# Patient Record
Sex: Male | Born: 1965 | Race: Black or African American | Hispanic: No | Marital: Single | Smoking: Never smoker
Health system: Southern US, Community
[De-identification: ages and names within clinical notes are randomized; demographics above are authoritative.]

## PROBLEM LIST (undated history)

## (undated) DIAGNOSIS — Q8583 Von Hippel-Lindau syndrome: Secondary | ICD-10-CM

## (undated) DIAGNOSIS — IMO0002 Reserved for concepts with insufficient information to code with codable children: Secondary | ICD-10-CM

## (undated) DIAGNOSIS — Q858 Other phakomatoses, not elsewhere classified: Secondary | ICD-10-CM

## (undated) DIAGNOSIS — D496 Neoplasm of unspecified behavior of brain: Secondary | ICD-10-CM

## (undated) DIAGNOSIS — K219 Gastro-esophageal reflux disease without esophagitis: Secondary | ICD-10-CM

## (undated) DIAGNOSIS — N289 Disorder of kidney and ureter, unspecified: Secondary | ICD-10-CM

## (undated) HISTORY — DX: Von Hippel-Lindau syndrome: Q85.83

## (undated) HISTORY — DX: Other phakomatoses, not elsewhere classified: Q85.8

## (undated) HISTORY — DX: Gastro-esophageal reflux disease without esophagitis: K21.9

## (undated) HISTORY — PX: KIDNEY SURGERY: SHX687

---

## 1999-09-11 HISTORY — PX: BRAIN TUMOR EXCISION: SHX577

## 2003-04-14 ENCOUNTER — Encounter: Payer: Self-pay | Admitting: Internal Medicine

## 2003-04-14 ENCOUNTER — Ambulatory Visit (HOSPITAL_COMMUNITY): Admission: RE | Admit: 2003-04-14 | Discharge: 2003-04-14 | Payer: Self-pay | Admitting: Internal Medicine

## 2005-03-15 ENCOUNTER — Ambulatory Visit (HOSPITAL_COMMUNITY): Admission: RE | Admit: 2005-03-15 | Discharge: 2005-03-15 | Payer: Self-pay | Admitting: Internal Medicine

## 2005-09-07 ENCOUNTER — Ambulatory Visit (HOSPITAL_COMMUNITY): Admission: RE | Admit: 2005-09-07 | Discharge: 2005-09-07 | Payer: Self-pay | Admitting: Family Medicine

## 2006-05-26 ENCOUNTER — Emergency Department (HOSPITAL_COMMUNITY): Admission: EM | Admit: 2006-05-26 | Discharge: 2006-05-26 | Payer: Self-pay | Admitting: Emergency Medicine

## 2007-06-19 ENCOUNTER — Ambulatory Visit: Payer: Self-pay | Admitting: Urgent Care

## 2007-06-30 ENCOUNTER — Ambulatory Visit (HOSPITAL_COMMUNITY): Admission: RE | Admit: 2007-06-30 | Discharge: 2007-06-30 | Payer: Self-pay | Admitting: Gastroenterology

## 2007-06-30 ENCOUNTER — Ambulatory Visit: Payer: Self-pay | Admitting: Gastroenterology

## 2007-06-30 ENCOUNTER — Encounter: Payer: Self-pay | Admitting: Gastroenterology

## 2007-06-30 HISTORY — PX: COLONOSCOPY: SHX174

## 2010-06-17 ENCOUNTER — Emergency Department (HOSPITAL_COMMUNITY): Admission: EM | Admit: 2010-06-17 | Discharge: 2010-06-17 | Payer: Self-pay | Admitting: Emergency Medicine

## 2010-06-18 ENCOUNTER — Emergency Department (HOSPITAL_COMMUNITY): Admission: EM | Admit: 2010-06-18 | Discharge: 2010-06-18 | Payer: Self-pay | Admitting: Emergency Medicine

## 2010-06-19 ENCOUNTER — Emergency Department (HOSPITAL_COMMUNITY): Admission: EM | Admit: 2010-06-19 | Discharge: 2010-06-19 | Payer: Self-pay | Admitting: Emergency Medicine

## 2010-11-22 LAB — WOUND CULTURE: Gram Stain: NONE SEEN

## 2010-12-16 ENCOUNTER — Emergency Department (HOSPITAL_COMMUNITY)
Admission: EM | Admit: 2010-12-16 | Discharge: 2010-12-16 | Disposition: A | Discharge status: Home / Self Care | Payer: BC Managed Care – PPO | Attending: Emergency Medicine | Admitting: Emergency Medicine

## 2010-12-16 DIAGNOSIS — T2016XA Burn of first degree of forehead and cheek, initial encounter: Secondary | ICD-10-CM | POA: Insufficient documentation

## 2010-12-16 DIAGNOSIS — T2121XA Burn of second degree of chest wall, initial encounter: Secondary | ICD-10-CM | POA: Insufficient documentation

## 2010-12-16 DIAGNOSIS — T2017XA Burn of first degree of neck, initial encounter: Secondary | ICD-10-CM | POA: Insufficient documentation

## 2010-12-16 DIAGNOSIS — X010XXA Exposure to flames in uncontrolled fire, not in building or structure, initial encounter: Secondary | ICD-10-CM | POA: Insufficient documentation

## 2010-12-16 DIAGNOSIS — T23219A Burn of second degree of unspecified thumb (nail), initial encounter: Secondary | ICD-10-CM | POA: Insufficient documentation

## 2010-12-16 DIAGNOSIS — T22219A Burn of second degree of unspecified forearm, initial encounter: Secondary | ICD-10-CM | POA: Insufficient documentation

## 2010-12-16 DIAGNOSIS — T23169A Burn of first degree of back of unspecified hand, initial encounter: Secondary | ICD-10-CM | POA: Insufficient documentation

## 2010-12-16 DIAGNOSIS — T310 Burns involving less than 10% of body surface: Secondary | ICD-10-CM | POA: Insufficient documentation

## 2010-12-16 DIAGNOSIS — T20119A Burn of first degree of unspecified ear [any part, except ear drum], initial encounter: Secondary | ICD-10-CM | POA: Insufficient documentation

## 2010-12-17 ENCOUNTER — Emergency Department (HOSPITAL_COMMUNITY)
Admission: EM | Admit: 2010-12-17 | Discharge: 2010-12-17 | Disposition: A | Discharge status: Home / Self Care | Payer: BC Managed Care – PPO | Attending: Emergency Medicine | Admitting: Emergency Medicine

## 2010-12-17 DIAGNOSIS — T22219A Burn of second degree of unspecified forearm, initial encounter: Secondary | ICD-10-CM | POA: Insufficient documentation

## 2010-12-17 DIAGNOSIS — T310 Burns involving less than 10% of body surface: Secondary | ICD-10-CM | POA: Insufficient documentation

## 2010-12-17 DIAGNOSIS — T2010XA Burn of first degree of head, face, and neck, unspecified site, initial encounter: Secondary | ICD-10-CM | POA: Insufficient documentation

## 2010-12-17 DIAGNOSIS — T2111XA Burn of first degree of chest wall, initial encounter: Secondary | ICD-10-CM | POA: Insufficient documentation

## 2010-12-17 DIAGNOSIS — IMO0002 Reserved for concepts with insufficient information to code with codable children: Secondary | ICD-10-CM | POA: Insufficient documentation

## 2011-01-17 ENCOUNTER — Inpatient Hospital Stay (INDEPENDENT_AMBULATORY_CARE_PROVIDER_SITE_OTHER)
Admission: RE | Admit: 2011-01-17 | Discharge: 2011-01-17 | Disposition: A | Discharge status: Home / Self Care | Payer: BC Managed Care – PPO | Source: Ambulatory Visit

## 2011-01-17 DIAGNOSIS — IMO0002 Reserved for concepts with insufficient information to code with codable children: Secondary | ICD-10-CM

## 2011-01-23 NOTE — Consult Note (Signed)
Daniel [name], [name]               ACCOUNT NO.:  [id number]   MEDICAL RECORD NO.:  [id number]          PATIENT TYPE:  AMB   LOCATION:  DAY                           FACILITY:  [facility]   PHYSICIAN:  Kassie Mends, M.D.      DATE OF BIRTH:  07-07-1966   DATE OF CONSULTATION:  DATE OF DISCHARGE:                                 CONSULTATION   REQUESTING PHYSICIAN:  Dr. Felecia Shelling.   REASON FOR CONSULTATION:  Rectal bleeding.   HISTORY OF PRESENT ILLNESS:  Daniel [name] is a 45 year old African-  American male.  He tells me, approximately 2 weeks ago, he had two  episodes of moderate to large volume rectal bleeding.  He noticed blood  in his stool.  He says he may have been a little bit constipated but  generally has a bowel movement every day.  He denies any problems with  diarrhea.  Denies abdominal pain.  He did notice a little bit of burning  proctalgia.  He denies any pruritus.  Denies any fever or chills.  Denies any nausea or vomiting.  He occasionally has heartburn for which  he takes Prevacid 30 mg daily, and this is well controlled.  He denies  anorexia or early satiety.  He denies any NSAID or aspirin use.   PAST MEDICAL AND SURGICAL HISTORY:  Chronic GERD.  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  In 2006, he had a right nephrectomy.  In  2007, he has history of  Von-Hipple Lindau syndrome.   CURRENT MEDICATIONS:  Prevacid 30 mg daily.   ALLERGIES:  NO KNOWN DRUG ALLERGIES.   FAMILY HISTORY:  There is no known family history of carcinoma of the  liver or chronic GI problems.   SOCIAL HISTORY:  Mr. Lamping is single.  He is engaged to be married.  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  He denies any tobacco or drug use.  He  consumes an alcoholic beverage once or twice per month.   REVIEW OF SYSTEMS:  See HPI.  Otherwise negative.   PHYSICAL EXAM:  VITAL SIGNS:  Weight 208 pounds, height 69 inches,  temperature 98.2, blood  pressure 104/68, pulse 60.  GENERAL:  Daniel [name] is a well-developed, well-nourished African-  American male in no acute distress.  HEENT.  Clear nonicteric.  Pink.  Oropharynx pink, moist without any  lesions.  NECK:  Supple without mass or thyromegaly.  CHEST:  Heart regular, rate and rhythm.  Normal S1-S2.  No rubs, clicks,  rubs or gallops.  LUNGS:  Clear auscultation bilaterally.  ABDOMEN:  Positive bowel sounds x4.  No bruits auscultated.  Soft,  nontender, nondistended without palpable mass or hepatosplenomegaly.  No  rebound tenderness or guarding.  EXTREMITIES:  Without clubbing or edema bilaterally.  RECTAL:  No external lesions visualized.  Good sphincter tone.  No  internal masses palpated.  Small amount of light brown stool was  obtained which is Hemoccult negative.   IMPRESSION:  1. Daniel [name] is a 45 year old African male with  two episodes of      moderate-to-large volume rectal bleeding.  Rectal exam in the      office today was normal, and I feel he needs further evaluation to      rule out colorectal carcinoma, as well as determine source of his      rectal bleeding, which could be benign anorectal source, such as      fissure or internal hemorrhoids, as well as diverticular bleeding.      This case has been discussed with Dr. Cira Servant in our plan of care as      outlined below.  2. He has chronic gastroesophageal reflux disease, well controlled on      proton pump.   PLAN:  Colonoscopy with Dr. Cira Servant in the near future.  I discussed the  procedure including risks and benefits including but not limited to  bleeding, infection, perforation, drug reaction.  He agrees with plan.  Consent will be obtained.  He is going to have NuLYTELY prep. The plan  is to continue Prevacid 30 grams daily.   I would like to Dr. Felecia Shelling for allowing [place] to participate in the care of  Daniel [name].      Lorenza Burton, N.P.      Kassie Mends, M.D.  Electronically Signed    KJ/MEDQ   D:  06/19/2007  T:  06/20/2007  Job:  [id number]

## 2011-01-23 NOTE — Op Note (Signed)
NAMEJAYJAY, Daniel [name]               ACCOUNT NO.:  [id number]   MEDICAL RECORD NO.:  [id number]          PATIENT TYPE:  AMB   LOCATION:  DAY                           FACILITY:  [facility]   PHYSICIAN:  Kassie Mends, M.D.      DATE OF BIRTH:  1966-02-02   DATE OF PROCEDURE:  06/30/2007  DATE OF DISCHARGE:                               OPERATIVE REPORT   REFERRING PHYSICIAN:  Dr. Felecia Shelling.   PROCEDURE:  Colonoscopy with cold forceps polypectomy.   INDICATION FOR EXAMINATION:  Daniel [name] is a 45 year old male who  complains of rectal bleeding.  He has seen rectal bleeding for about a  month.  It is intermittent.   FINDINGS:  1. A 4- to 5-mm sessile rectal polyp removed via cold forceps.  2. Area in the transverse colon which appeared to be a healing      ulcerated lesion.  Biopsies obtained via cold forceps.  There was      no evidence of ulceration, just erosion. Otherwise no masses,      diverticular or AVMs seen.  3. Approximately 5 cm of the distal terminal ileum was visualized.  4. Small internal hemorrhoids.   RECOMMENDATIONS:  1. Hemorrhoids may be the likely source of rectal bleeding.  The      rectal polyp was too small to be clinically significant.  He should      follow a high-fiber diet.  He is given a handout on high-fiber      diet, polyps and hemorrhoids.  2. Will call Mr. Pineo with results of his polypectomy.  If his      polyps adenomatous, then he should have a screening colonoscopy in      5 years, and as well, all first-degree relatives should have a      screening colonoscopy at age 89 and then every 5 years.  3. No aspirin, NSAIDs or anticoagulation for 5 days.   MEDICATIONS:  1. Demerol 100 mg IV.  2. Versed 6 mg IV.   PROCEDURE TECHNIQUE:  Physical exam was performed.  Informed consent was  obtained from the patient explaining the benefits, risks and  alternatives to the procedure.  The patient was connected to monitor and  placed in left lateral position.   Continuous oxygen was provided by  nasal cannula and IV medicine administered through an indwelling  cannula.  After administration of sedation and rectal exam, the  patient's rectum was intubated, and the  scope was advanced under direct visualization to the distal terminal  ileum.  The scope was removed slowly by carefully examining the color,  texture, anatomy and integrity of the mucosa on the way out.  The  patient was recovered in endoscopy and discharged home in satisfactory  condition.      Kassie Mends, M.D.  Electronically Signed     SM/MEDQ  D:  06/30/2007  T:  07/01/2007  Job:  [id number]   cc:   Tesfaye D. Felecia Shelling, MD  Fax: [phone]

## 2011-01-26 NOTE — Procedures (Signed)
[name], Daniel [name]               ACCOUNT NO.:  [id number]   MEDICAL RECORD NO.:  [id number]          PATIENT TYPE:  OUT   LOCATION:  RAD                           FACILITY:  [facility]   PHYSICIAN:  Edward L. Juanetta Gosling, M.D.DATE OF BIRTH:  11-18-1965   DATE OF PROCEDURE:  03/15/2005  DATE OF DISCHARGE:                              PULMONARY FUNCTION TEST   RESULTS:  1.  Spirometry is normal.  2.  Lung volumes show a minimal decrease in total lung capacity.  3.  Diffusion capacity of carbon dioxide (DLCO) is normal.  4.  Arterial blood gasses are normal.  The mild reduction in total lung      capacity may be related to body habitus.  He is listed as being  66      inches tall and 215 pounds.       ELH/MEDQ  D:  03/22/2005  T:  03/22/2005  Job:  [id number]

## 2012-04-23 ENCOUNTER — Encounter (HOSPITAL_COMMUNITY): Payer: Self-pay | Admitting: Emergency Medicine

## 2012-04-23 ENCOUNTER — Emergency Department (HOSPITAL_COMMUNITY)
Admission: EM | Admit: 2012-04-23 | Discharge: 2012-04-24 | Disposition: A | Discharge status: Home / Self Care | Payer: BC Managed Care – PPO | Attending: Emergency Medicine | Admitting: Emergency Medicine

## 2012-04-23 DIAGNOSIS — K648 Other hemorrhoids: Secondary | ICD-10-CM | POA: Insufficient documentation

## 2012-04-23 DIAGNOSIS — K219 Gastro-esophageal reflux disease without esophagitis: Secondary | ICD-10-CM | POA: Insufficient documentation

## 2012-04-23 DIAGNOSIS — N289 Disorder of kidney and ureter, unspecified: Secondary | ICD-10-CM | POA: Insufficient documentation

## 2012-04-23 DIAGNOSIS — K625 Hemorrhage of anus and rectum: Secondary | ICD-10-CM

## 2012-04-23 HISTORY — DX: Disorder of kidney and ureter, unspecified: N28.9

## 2012-04-23 HISTORY — DX: Neoplasm of unspecified behavior of brain: D49.6

## 2012-04-23 HISTORY — DX: Reserved for concepts with insufficient information to code with codable children: IMO0002

## 2012-04-23 LAB — CBC WITH DIFFERENTIAL/PLATELET
Basophils Relative: 0 % (ref 0–1)
Eosinophils Absolute: 0.1 10*3/uL (ref 0.0–0.7)
Eosinophils Relative: 0 % (ref 0–5)
MCH: 25.4 pg — ABNORMAL LOW (ref 26.0–34.0)
MCHC: 33.5 g/dL (ref 30.0–36.0)
Neutrophils Relative %: 90 % — ABNORMAL HIGH (ref 43–77)
Platelets: 191 10*3/uL (ref 150–400)
RDW: 13.9 % (ref 11.5–15.5)

## 2012-04-23 LAB — BASIC METABOLIC PANEL
Calcium: 9.5 mg/dL (ref 8.4–10.5)
GFR calc Af Amer: 72 mL/min — ABNORMAL LOW (ref >90)
GFR calc non Af Amer: 62 mL/min — ABNORMAL LOW (ref >90)
Potassium: 3.7 mEq/L (ref 3.5–5.1)
Sodium: 138 mEq/L (ref 135–145)

## 2012-04-23 MED ORDER — PANTOPRAZOLE SODIUM 40 MG IV SOLR
40.0000 mg | Freq: Once | INTRAVENOUS | Status: AC
  Administered 2012-04-23: 40 mg via INTRAVENOUS
  Filled 2012-04-23: qty 40

## 2012-04-23 MED ORDER — ONDANSETRON HCL 4 MG/2ML IJ SOLN
4.0000 mg | Freq: Once | INTRAMUSCULAR | Status: AC
  Administered 2012-04-23: 4 mg via INTRAVENOUS
  Filled 2012-04-23: qty 2

## 2012-04-23 NOTE — ED Notes (Signed)
Patient reports rectal bleeding that started this morning, reports bright red blood. States decreased throughout the day and has stopped now. Patient also reports nausea and emesis today, denies blood in emesis. Also complaining of chills, denies pain.

## 2012-04-23 NOTE — ED Provider Notes (Signed)
History     CSN: [id number]  Arrival date & time 04/23/12  2249   First MD Initiated Contact with Patient 04/23/12 2305      Chief Complaint  Patient presents with  . Chills  . Rectal Bleeding  . Emesis    (Consider location/radiation/quality/duration/timing/severity/associated sxs/prior treatment) HPI  Daniel [name] is a 46 y.o. male who presents to the Emergency Department complaining of rectal bleeding that began this morning. It started as bright red blood and has decreased over the course of the day. Now with faint blood. Has had a shaking chill, nausea and vomiting this evening.  He says he may have been a little bit constipated but generally has a bowel movement every day.He denies any pruritus. Denies any fever or chills, NSAID or aspirin use.   Patient with previous rectal bleeding in 2008. Evaluated by Dr. Darrick Penna, GI. Colonoscopy performed 06-30-2007 with :FINDINGS:  1. A 4- to 5-mm sessile rectal polyp removed via cold forceps.  2. Area in the transverse colon which appeared to be a healing  ulcerated lesion. Biopsies obtained via cold forceps. There was  no evidence of ulceration, just erosion. Otherwise no masses,  diverticular or AVMs seen.  3. Approximately 5 cm of the distal terminal ileum was visualized.  4. Small internal hemorrhoids  PCP Dr. Felecia Shelling  Past Medical History  Diagnosis Date  . Brain tumor   . Renal disorder   . Abdominal cyst   PAST MEDICAL AND SURGICAL HISTORY: Chronic GERD. 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 In 2006, he had a right nephrectomy. In  2007, he has history of Von-Hipple Lindau syndrome.   Past Surgical History  Procedure Date  . Brain tumor excision   . Kidney surgery     History reviewed. No pertinent family history.  History  Substance Use Topics  . Smoking status: Never Smoker   . Smokeless tobacco: Not on file  . Alcohol Use: Yes     occassional        Review of Systems  Constitutional: Positive for fever and chills.       10 Systems reviewed and are negative for acute change except as noted in the HPI.  HENT: Negative for congestion.   Eyes: Negative for discharge and redness.  Respiratory: Negative for cough and shortness of breath.   Cardiovascular: Negative for chest pain.  Gastrointestinal: Positive for nausea, vomiting, anal bleeding and rectal pain. Negative for abdominal pain.  Musculoskeletal: Negative for back pain.  Skin: Negative for rash.  Neurological: Negative for syncope, numbness and headaches.  Psychiatric/Behavioral:       No behavior change.    Allergies  Review of patient's allergies indicates no known allergies.  Home Medications  No current outpatient prescriptions on file.  BP 122/83  Pulse 109  Temp 100.5 F (38.1 C) (Oral)  Resp 18  Ht 5\' 8"  (1.727 m)  Wt 215 lb (97.523 kg)  BMI 32.69 kg/m2  SpO2 97%  Physical Exam  Nursing note and vitals reviewed. Constitutional: He appears well-developed and well-nourished.       Awake, alert, nontoxic appearance.  HENT:  Head: Atraumatic.  Eyes: Right eye exhibits no discharge. Left eye exhibits no discharge.  Neck: Neck supple.  Pulmonary/Chest: Effort normal. He exhibits no tenderness.  Abdominal: Soft. There is no tenderness. There is no rebound and no guarding.  Genitourinary:       Tenderness with palpation of  rectum, small internal hemorrhoids. Trace positive guiac  Musculoskeletal: He exhibits no tenderness.       Baseline ROM, no obvious new focal weakness.  Neurological:       Mental status and motor strength appears baseline for patient and situation.  Skin: No rash noted.  Psychiatric: He has a normal mood and affect.    ED Course  Procedures (including critical care time)  Results for orders placed during the hospital encounter of 04/23/12  CBC WITH DIFFERENTIAL      Component Value Range   WBC 12.1 (*) 4.0 - 10.5 K/uL    RBC 5.44  4.22 - 5.81 MIL/uL   Hemoglobin 13.8  13.0 - 17.0 g/dL   HCT 16.1  09.6 - 04.5 %   MCV 75.7 (*) 78.0 - 100.0 fL   MCH 25.4 (*) 26.0 - 34.0 pg   MCHC 33.5  30.0 - 36.0 g/dL   RDW 40.9  81.1 - 91.4 %   Platelets 191  150 - 400 K/uL   Neutrophils Relative 90 (*) 43 - 77 %   Neutro Abs 10.9 (*) 1.7 - 7.7 K/uL   Lymphocytes Relative 8 (*) 12 - 46 %   Lymphs Abs 1.0  0.7 - 4.0 K/uL   Monocytes Relative 1 (*) 3 - 12 %   Monocytes Absolute 0.2  0.1 - 1.0 K/uL   Eosinophils Relative 0  0 - 5 %   Eosinophils Absolute 0.1  0.0 - 0.7 K/uL   Basophils Relative 0  0 - 1 %   Basophils Absolute 0.0  0.0 - 0.1 K/uL  BASIC METABOLIC PANEL      Component Value Range   Sodium 138  135 - 145 mEq/L   Potassium 3.7  3.5 - 5.1 mEq/L   Chloride 104  96 - 112 mEq/L   CO2 22  19 - 32 mEq/L   Glucose, Bld 101 (*) 70 - 99 mg/dL   BUN 24 (*) 6 - 23 mg/dL   Creatinine, Ser 7.82  0.50 - 1.35 mg/dL   Calcium 9.5  8.4 - 95.6 mg/dL   GFR calc non Af Amer 62 (*) >90 mL/min   GFR calc Af Amer 72 (*) >90 mL/min     MDM  Patient with rectal bleeding that began this morning. Bleeding has markedly decreased over the course of the day. PE with tenderness and internal hemorrhoids. Hgb is stable. Dx testing d/w pt and family.  Questions answered.  Verb understanding, agreeable to d/c home with outpt f/u with Dr. Darrick Penna.Pt stable in ED with no significant deterioration in condition.The patient appears reasonably screened and/or stabilized for discharge and I doubt any other medical condition or other [facility] requiring further screening, evaluation, or treatment in the ED at this time prior to discharge.  MDM Reviewed: nursing note and vitals Interpretation: labs            Nicoletta Dress. Colon Branch, MD 04/24/12 [phone]

## 2012-04-28 ENCOUNTER — Encounter: Payer: Self-pay | Admitting: Gastroenterology

## 2012-04-28 ENCOUNTER — Ambulatory Visit (INDEPENDENT_AMBULATORY_CARE_PROVIDER_SITE_OTHER): Payer: BC Managed Care – PPO | Admitting: Gastroenterology

## 2012-04-28 VITALS — BP 108/70 | HR 66 | Temp 97.6°F | Ht 68.0 in | Wt 200.6 lb

## 2012-04-28 DIAGNOSIS — K625 Hemorrhage of anus and rectum: Secondary | ICD-10-CM | POA: Insufficient documentation

## 2012-04-28 MED ORDER — HYDROCORTISONE ACETATE 25 MG RE SUPP: 25.0000 mg | Freq: Two times a day (BID) | RECTAL | Status: DC

## 2012-04-28 MED ORDER — PEG 3350-KCL-NA BICARB-NACL 420 G PO SOLR: 4000.0000 L | ORAL | Status: AC

## 2012-04-28 NOTE — Assessment & Plan Note (Signed)
46 year old with rectal bleeding in the setting of known internal hemorrhoids, last TCS 2008 with adenomatous polyps, now due for surveillance. Likely benign source; however, as he is due for surveillance colonoscopy this year, we will proceed at this point with lower GI tract evaluation. Will provide Anusol suppositories as well. Instructions to seek medical attention if any worsening of symptoms.   Proceed with colonoscopy with Dr. Darrick Penna in the near future. The risks, benefits, and alternatives have been discussed in detail with the patient. They state understanding and desire to proceed.

## 2012-04-28 NOTE — Progress Notes (Signed)
Primary Care Physician:  Avon Gully, MD Primary Gastroenterologist:  Dr. Darrick Penna   Chief Complaint  Patient presents with  . Rectal Bleeding    HPI:   46 year old who presents today for recent onset of rectal bleeding, starting a few days ago. Started as large volume then lightened up, finally stopping. No rectal pain or pruritis. States soft stools. No fever, chills. No abdominal pain. No wt loss or lack of appetite. Ibuprofen sparingly for headaches. No aspirin powders. Last TCS by Dr. Darrick Penna in 2008 with tubular adenoma, internal hemorrhoids, benign healing ulcerated lesion.   Seen at [facility] ED 8/14, physical exam with small internal hemorrhoids.   Lab Results  Component Value Date   WBC 12.1* 04/23/2012   HGB 13.8 04/23/2012   HCT 41.2 04/23/2012   MCV 75.7* 04/23/2012   PLT 191 04/23/2012     Past Medical History  Diagnosis Date  . Brain tumor   . Renal disorder   . Abdominal cyst   . VHL (von Hippel-Lindau syndrome)   . GERD (gastroesophageal reflux disease)     Past Surgical History  Procedure Date  . Brain tumor excision   . Kidney surgery   . Colonoscopy 06/30/2007    area of transverse colon appearing to be healing ulcerated lesion, benign/tubular adenoma / Approximately 5 cm of the distal terminal ileum was visualized/  Small internal hemorrhoids    Current Outpatient Prescriptions  Medication Sig Dispense Refill  . hydrocortisone (ANUSOL-HC) 25 MG suppository Place 1 suppository (25 mg total) rectally every 12 (twelve) hours.  14 suppository  1  . polyethylene glycol-electrolytes (TRILYTE) 420 G solution Take 4,000,000 mLs by mouth as directed.  4000 mL  0    Allergies as of 04/28/2012  . (No Known Allergies)    Family History  Problem Relation Age of Onset  . Colon cancer Neg Hx     History   Social History  . Marital Status: Single    Spouse Name: N/A    Number of Children: N/A  . Years of Education: N/A   Occupational History  . Not on file.     Social History Main Topics  . Smoking status: Never Smoker   . Smokeless tobacco: Not on file  . Alcohol Use: Yes     occassional  . Drug Use: No  . Sexually Active:    Other Topics Concern  . Not on file   Social History Narrative  . No narrative on file    Review of Systems: Gen: Denies any fever, chills, loss of appetite, fatigue, weight loss. CV: Denies chest pain, heart palpitations, syncope, peripheral edema. Resp: Denies shortness of breath with rest, cough, wheezing GI: Denies dysphagia or odynophagia. Denies hematemesis, fecal incontinence, or jaundice.  GU : Denies urinary burning, urinary frequency, urinary incontinence.  MS: Denies joint pain, muscle weakness, cramps, limited movement Derm: Denies rash, itching, dry skin Psych: Denies depression, anxiety, confusion or memory loss  Heme: Denies bruising, bleeding, and enlarged lymph nodes.  Physical Exam: BP 108/70  Pulse 66  Temp 97.6 F (36.4 C) (Temporal)  Ht 5\' 8"  (1.727 m)  Wt 200 lb 9.6 oz (90.992 kg)  BMI 30.50 kg/m2 General:   Alert and oriented. Well-developed, well-nourished, pleasant and cooperative. Head:  Normocephalic and atraumatic. Eyes:  Conjunctiva pink, sclera clear, no icterus.    Ears:  Normal auditory acuity. Nose:  No deformity, discharge,  or lesions. Mouth:  No deformity or lesions, mucosa pink and moist.  Neck:  Supple,  without mass or thyromegaly. Lungs:  Clear to auscultation bilaterally, without wheezing, rales, or rhonchi.  Heart:  S1, S2 present without murmurs noted.  Abdomen:  +BS, soft, non-tender and non-distended. Without mass or HSM. No rebound or guarding. No hernias noted. Rectal:  Deferred  Msk:  Symmetrical without gross deformities. Normal posture. Extremities:  Without clubbing or edema. Neurologic:  Alert and  oriented x4;  grossly normal neurologically. Skin:  Intact, warm and dry without significant lesions or rashes Cervical Nodes:  No significant cervical  adenopathy. Psych:  Alert and cooperative. Normal mood and affect.

## 2012-04-28 NOTE — Patient Instructions (Addendum)
You will be undergoing a colonoscopy in the near future with Dr. Darrick Penna.  I have sent a prescription for suppositories to your pharmacy. Place one twice a day for 7 days. This will help with any inflammation, irritation.  Please seek medical attention right away if large amounts of bleeding, abdominal pain, weakness.

## 2012-04-28 NOTE — Progress Notes (Signed)
Faxed to PCP

## 2012-04-30 ENCOUNTER — Ambulatory Visit: Payer: BC Managed Care – PPO | Admitting: Gastroenterology

## 2012-05-13 ENCOUNTER — Encounter (HOSPITAL_COMMUNITY): Payer: Self-pay | Admitting: Pharmacy Technician

## 2012-05-29 ENCOUNTER — Encounter (HOSPITAL_COMMUNITY): Payer: Self-pay | Admitting: *Deleted

## 2012-05-29 ENCOUNTER — Ambulatory Visit (HOSPITAL_COMMUNITY)
Admission: RE | Admit: 2012-05-29 | Discharge: 2012-05-29 | Disposition: A | Discharge status: Home / Self Care | Payer: BC Managed Care – PPO | Source: Ambulatory Visit | Attending: Gastroenterology | Admitting: Gastroenterology

## 2012-05-29 ENCOUNTER — Encounter (HOSPITAL_COMMUNITY)
Admission: RE | Disposition: A | Discharge status: Home / Self Care | Payer: Self-pay | Source: Ambulatory Visit | Attending: Gastroenterology

## 2012-05-29 DIAGNOSIS — K625 Hemorrhage of anus and rectum: Secondary | ICD-10-CM

## 2012-05-29 DIAGNOSIS — K648 Other hemorrhoids: Secondary | ICD-10-CM | POA: Insufficient documentation

## 2012-05-29 DIAGNOSIS — D126 Benign neoplasm of colon, unspecified: Secondary | ICD-10-CM | POA: Insufficient documentation

## 2012-05-29 HISTORY — PX: COLONOSCOPY: SHX5424

## 2012-05-29 SURGERY — COLONOSCOPY
Anesthesia: Moderate Sedation

## 2012-05-29 MED ORDER — MEPERIDINE HCL 100 MG/ML IJ SOLN
INTRAMUSCULAR | Status: AC
  Filled 2012-05-29: qty 2

## 2012-05-29 MED ORDER — MIDAZOLAM HCL 5 MG/5ML IJ SOLN
INTRAMUSCULAR | Status: AC
  Filled 2012-05-29: qty 10

## 2012-05-29 MED ORDER — STERILE WATER FOR IRRIGATION IR SOLN
Status: DC | PRN
  Administered 2012-05-29: 11:00:00

## 2012-05-29 MED ORDER — MIDAZOLAM HCL 5 MG/5ML IJ SOLN
INTRAMUSCULAR | Status: DC | PRN
  Administered 2012-05-29 (×2): 2 mg via INTRAVENOUS

## 2012-05-29 MED ORDER — SODIUM CHLORIDE 0.45 % IV SOLN
INTRAVENOUS | Status: DC
  Administered 2012-05-29: 11:00:00 via INTRAVENOUS

## 2012-05-29 MED ORDER — MEPERIDINE HCL 100 MG/ML IJ SOLN
INTRAMUSCULAR | Status: DC | PRN
  Administered 2012-05-29: 50 mg via INTRAVENOUS
  Administered 2012-05-29: 25 mg via INTRAVENOUS

## 2012-05-29 NOTE — Op Note (Signed)
[facility] [address] [place], [address]   COLONOSCOPY PROCEDURE REPORT  PATIENT: Daniel [name], Daniel [name]  MR#: [id number] BIRTHDATE: 1965/12/18 , 46  yrs. old GENDER: Male ENDOSCOPIST: Jonette Eva, MD REFERRED XB:[id number] Fanta, M.D. PROCEDURE DATE:  05/29/2012 PROCEDURE:   Colonoscopy with biopsy INDICATIONS:rectal bleeding. MEDICATIONS: Demerol 75 mg IV and Versed 4 mg IV  DESCRIPTION OF PROCEDURE:    Physical exam was performed.  Informed consent was obtained from the patient after explaining the benefits, risks, and alternatives to procedure.  The patient was connected to monitor and placed in left lateral position. Continuous oxygen was provided by nasal cannula and IV medicine administered through an indwelling cannula.  After administration of sedation and rectal exam, the patients rectum was intubated and the EC-[id number] ([id number])  colonoscope was advanced under direct visualization to the ileum.  The scope was removed slowly by carefully examining the color, texture, anatomy, and integrity mucosa on the way out.  The patient was recovered in endoscopy and discharged home in satisfactory condition.       COLON FINDINGS: A sessile polyp measuring 3 mm in size was found in the transverse colon.  A polypectomy was performed with cold forceps.  , Small internal hemorrhoids were found.  , and The colon was otherwise normal.  There was no diverticulosis, inflammation, or cancers unless previously stated.  PREP QUALITY: excellent. CECAL W/D TIME: 11.5 minutes  COMPLICATIONS: None  ENDOSCOPIC IMPRESSION: 1.   Sessile polyp measuring 3 mm in size was found in the transverse colon; polypectomy was performed with cold forceps 2.   Small internal hemorrhoids 3.   The colon was otherwise normal   RECOMMENDATIONS: HIGH FIBER DIET AWAIT BIOPSY TCS IN 10 YEARS IF SIMPLE ADENOMA       _______________________________ Rosalie DoctorJonette Eva, MD  05/29/2012 11:25 AM     PATIENT NAME:  Daniel [name], Daniel [name] MR#: [id number]

## 2012-05-29 NOTE — H&P (Signed)
  Primary Care Physician:  Avon Gully, MD Primary Gastroenterologist:  Dr. Darrick Penna  Pre-Procedure History & Physical: HPI:  Daniel [name] is a 46 y.o. male here for  BRBPR.   Past Medical History  Diagnosis Date  . Brain tumor   . Renal disorder   . Abdominal cyst   . VHL (von Hippel-Lindau syndrome)   . GERD (gastroesophageal reflux disease)     Past Surgical History  Procedure Date  . Brain tumor excision   . Kidney surgery   . Colonoscopy 06/30/2007    area of transverse colon appearing to be healing ulcerated lesion, benign/tubular adenoma / Approximately 5 cm of the distal terminal ileum was visualized/  Small internal hemorrhoids    Prior to Admission medications   Not on File    Allergies as of 04/28/2012  . (No Known Allergies)    Family History  Problem Relation Age of Onset  . Colon cancer Neg Hx     History   Social History  . Marital Status: Single    Spouse Name: N/A    Number of Children: N/A  . Years of Education: N/A   Occupational History  . Not on file.   Social History Main Topics  . Smoking status: Never Smoker   . Smokeless tobacco: Not on file  . Alcohol Use: Yes     occassional  . Drug Use: No  . Sexually Active:    Other Topics Concern  . Not on file   Social History Narrative  . No narrative on file    Review of Systems: See HPI, otherwise negative ROS   Physical Exam: BP 142/84  Pulse 65  Temp 98 F (36.7 C) (Oral)  Resp 20  Ht 5\' 8"  (1.727 m)  Wt 200 lb (90.719 kg)  BMI 30.41 kg/m2  SpO2 96% General:   Alert,  pleasant and cooperative in NAD Head:  Normocephalic and atraumatic. Neck:  Supple; Lungs:  Clear throughout to auscultation.    Heart:  Regular rate and rhythm. Abdomen:  Soft, nontender and nondistended. Normal bowel sounds, without guarding, and without rebound.   Neurologic:  Alert and  oriented x4;  grossly normal neurologically.  Impression/Plan:    BRBPR  PLAN: TCS TODAY

## 2012-06-02 ENCOUNTER — Encounter (HOSPITAL_COMMUNITY): Payer: Self-pay | Admitting: Gastroenterology

## 2012-06-03 ENCOUNTER — Telehealth: Payer: Self-pay | Admitting: Gastroenterology

## 2012-06-03 NOTE — Telephone Encounter (Signed)
Path faxed to PCP, recall made 

## 2012-06-03 NOTE — Telephone Encounter (Signed)
LM for pt to call

## 2012-06-03 NOTE — Telephone Encounter (Signed)
Please call pt. HE had a polypoid lesion removed and it was benign. FOLLOW A High fiber diet. TCS in 10 years.   

## 2012-06-04 NOTE — Telephone Encounter (Signed)
Called and informed pts wife

## 2012-06-22 NOTE — Progress Notes (Signed)
TCS SEP 2013 BENIGN POLYPOID TISSUE  REVIEWED.

## 2014-12-16 ENCOUNTER — Emergency Department (INDEPENDENT_AMBULATORY_CARE_PROVIDER_SITE_OTHER)
Admission: EM | Admit: 2014-12-16 | Discharge: 2014-12-16 | Disposition: A | Discharge status: Home / Self Care | Payer: BLUE CROSS/BLUE SHIELD | Source: Home / Self Care | Attending: Family Medicine | Admitting: Family Medicine

## 2014-12-16 ENCOUNTER — Encounter (HOSPITAL_COMMUNITY): Payer: Self-pay | Admitting: Family Medicine

## 2014-12-16 DIAGNOSIS — L03116 Cellulitis of left lower limb: Secondary | ICD-10-CM | POA: Diagnosis not present

## 2014-12-16 MED ORDER — CLINDAMYCIN HCL 300 MG PO CAPS: 300.0000 mg | ORAL_CAPSULE | Freq: Three times a day (TID) | ORAL | Status: DC

## 2014-12-16 NOTE — ED Notes (Signed)
Pt states that his knee started swelling and turned red this morning.

## 2014-12-16 NOTE — Discharge Instructions (Signed)
You have a skin infection called cellulitis. This is caused by bacteria. Please take clindamycin for the full 7 days as prescribed. Please go to the emergency room if you get worse. Please used Tylenol for the pain.

## 2014-12-16 NOTE — ED Provider Notes (Signed)
CSN: [id number]     Arrival date & time 12/16/14  1856 History   First MD Initiated Contact with Patient 12/16/14 [phone]     Chief Complaint  Patient presents with  . Cellulitis   (Consider location/radiation/quality/duration/timing/severity/associated sxs/prior Treatment) HPI L knee: pain. Started 1 day ago. Constant. Getting worse. Described as sharp. Worse w/ certain movememtns and walking. Has not taken anything for the pain. Improves with elevation but continues to progress. Denies injury to the leg. Denies discharge. Denies fevers, chest pain, palpitations, nausea, vomiting, abdominal pain, diarrhea, constipation, headache.    Past Medical History  Diagnosis Date  . Brain tumor   . Renal disorder   . Abdominal cyst   . VHL (von Hippel-Lindau syndrome)   . GERD (gastroesophageal reflux disease)    Past Surgical History  Procedure Laterality Date  . Kidney surgery    . Colonoscopy  06/30/2007    area of transverse colon appearing to be healing ulcerated lesion, benign/tubular adenoma / Approximately 5 cm of the distal terminal ileum was visualized/  Small internal hemorrhoids  . Colonoscopy  05/29/2012    Procedure: COLONOSCOPY;  Surgeon: Danie Binder, MD;  [facility]: [facility];  Service: Endoscopy;  Laterality: N/A;  11:30  . Brain tumor excision  2001   Family History  Problem Relation Age of Onset  . Colon cancer Neg Hx   . Cancer Neg Hx   . Diabetes Neg Hx   . Heart failure Neg Hx   . Hyperlipidemia Neg Hx    History  Substance Use Topics  . Smoking status: Never Smoker   . Smokeless tobacco: Not on file  . Alcohol Use: Yes     Comment: occassional    Review of Systems Per HPI with all other pertinent systems negative.   Allergies  Review of patient's allergies indicates no known allergies.  Home Medications   Prior to Admission medications   Medication Sig Start Date End Date Taking? Authorizing Provider  clindamycin (CLEOCIN) 300 MG capsule Take 1  capsule (300 mg total) by mouth 3 (three) times daily. 12/16/14   Waldemar Dickens, MD   BP 115/78 mmHg  Pulse 78  Temp(Src) 98.8 F (37.1 C) (Oral)  Resp 18  SpO2 97% Physical Exam Physical Exam  Constitutional: oriented to person, place, and time. appears well-developed and well-nourished. No distress.  HENT:  Head: Normocephalic and atraumatic.  Eyes: EOMI. PERRL.  Neck: Normal range of motion.  Cardiovascular: RRR, no m/r/g, 2+ distal pulses,  Pulmonary/Chest: Effort normal and breath sounds normal. No respiratory distress.  Abdominal: Soft. Bowel sounds are normal. NonTTP, no distension.  Musculoskeletal: Normal range of motion. Non ttp, no effusion.  Neurological: alert and oriented to person, place, and time.  Skin: Left lower extremity just below the knee with erythematous and mildly indurated approximately 4 x 6 cm in size. No purulence. No effusion of the knee. Full range of motion of the knee.Marland Kitchen  Psychiatric: normal mood and affect. behavior is normal. Judgment and thought content normal.   ED Course  Procedures (including critical care time) Labs Review Labs Reviewed - No data to display  Imaging Review No results found.   MDM   1. Cellulitis of leg, left    Clindamycin   Precautions given and all questions answered  Linna Darner, MD Family Medicine 12/16/2014, 10:02 AM      Waldemar Dickens, MD 12/16/14 1002

## 2015-02-03 ENCOUNTER — Emergency Department (HOSPITAL_COMMUNITY)
Admission: EM | Admit: 2015-02-03 | Discharge: 2015-02-03 | Disposition: A | Discharge status: Home / Self Care | Payer: BLUE CROSS/BLUE SHIELD | Attending: Emergency Medicine | Admitting: Emergency Medicine

## 2015-02-03 ENCOUNTER — Encounter (HOSPITAL_COMMUNITY): Payer: Self-pay | Admitting: Neurology

## 2015-02-03 ENCOUNTER — Emergency Department (HOSPITAL_COMMUNITY): Payer: BLUE CROSS/BLUE SHIELD

## 2015-02-03 DIAGNOSIS — Q858 Other phakomatoses, not elsewhere classified: Secondary | ICD-10-CM | POA: Diagnosis not present

## 2015-02-03 DIAGNOSIS — Z8719 Personal history of other diseases of the digestive system: Secondary | ICD-10-CM | POA: Diagnosis not present

## 2015-02-03 DIAGNOSIS — Z86011 Personal history of benign neoplasm of the brain: Secondary | ICD-10-CM | POA: Insufficient documentation

## 2015-02-03 DIAGNOSIS — L03115 Cellulitis of right lower limb: Secondary | ICD-10-CM

## 2015-02-03 DIAGNOSIS — Z792 Long term (current) use of antibiotics: Secondary | ICD-10-CM | POA: Insufficient documentation

## 2015-02-03 DIAGNOSIS — Z87448 Personal history of other diseases of urinary system: Secondary | ICD-10-CM | POA: Insufficient documentation

## 2015-02-03 DIAGNOSIS — R05 Cough: Secondary | ICD-10-CM | POA: Insufficient documentation

## 2015-02-03 MED ORDER — CLINDAMYCIN HCL 150 MG PO CAPS
300.0000 mg | ORAL_CAPSULE | Freq: Once | ORAL | Status: AC
  Administered 2015-02-03: 300 mg via ORAL
  Filled 2015-02-03: qty 2

## 2015-02-03 MED ORDER — IBUPROFEN 400 MG PO TABS
800.0000 mg | ORAL_TABLET | Freq: Once | ORAL | Status: AC
  Administered 2015-02-03: 800 mg via ORAL
  Filled 2015-02-03: qty 2

## 2015-02-03 MED ORDER — CLINDAMYCIN HCL 150 MG PO CAPS: 300.0000 mg | ORAL_CAPSULE | Freq: Three times a day (TID) | ORAL | Status: DC

## 2015-02-03 MED ORDER — HYDROCODONE-ACETAMINOPHEN 5-325 MG PO TABS: 2.0000 | ORAL_TABLET | ORAL | Status: DC | PRN

## 2015-02-03 NOTE — Discharge Instructions (Signed)
Take Clindamycin as directed until gone. Take Vicodin as needed for pain. Refer to attached documents for more information. Return to the ED in 2 days for wound recheck.

## 2015-02-03 NOTE — ED Notes (Signed)
Pt reports abscess to right upper leg for several days; no drainage. Also cough, runny nose for 1 week.

## 2015-02-03 NOTE — ED Provider Notes (Signed)
CSN: [id number]     Arrival date & time 02/03/15  0945 History  This chart was scribed for non-physician practitioner, Alvina Chou, PA-C, working with Alfonzo Beers, MD, by Ian Bushman, ED Scribe. This patient was seen in room TR11C/TR11C and the patient's care was started at 10:35 AM.      Chief Complaint  Patient presents with  . Abscess  . URI     (Consider location/radiation/quality/duration/timing/severity/associated sxs/prior Treatment) HPI  HPI Comments: Daniel [name] is a 49 y.o. male who presents to the Emergency Department complaining of an  abcess on his right upper thigh onset 4 days ago couple days ago.  Patient denies any drainage currently from the area and states that he gets them frequently on different parts of his body. He has not yet taken any antibiotics.  He is also complaining of a cough/rhinorrhea onset 1 week ago.  He has associated symptoms of mild sweating.  He denies any abdominal pain and has no other complaints today.     Past Medical History  Diagnosis Date  . Brain tumor   . Renal disorder   . Abdominal cyst   . VHL (von Hippel-Lindau syndrome)   . GERD (gastroesophageal reflux disease)    Past Surgical History  Procedure Laterality Date  . Kidney surgery    . Colonoscopy  06/30/2007    area of transverse colon appearing to be healing ulcerated lesion, benign/tubular adenoma / Approximately 5 cm of the distal terminal ileum was visualized/  Small internal hemorrhoids  . Colonoscopy  05/29/2012    Procedure: COLONOSCOPY;  Surgeon: Danie Binder, MD;  [facility]: [facility];  Service: Endoscopy;  Laterality: N/A;  11:30  . Brain tumor excision  2001   Family History  Problem Relation Age of Onset  . Colon cancer Neg Hx   . Cancer Neg Hx   . Diabetes Neg Hx   . Heart failure Neg Hx   . Hyperlipidemia Neg Hx    History  Substance Use Topics  . Smoking status: Never Smoker   . Smokeless tobacco: Not on file  . Alcohol Use: Yes      Comment: occassional    Review of Systems  Constitutional: Negative for fever and chills.  HENT: Positive for rhinorrhea.   Respiratory: Positive for cough.   Gastrointestinal: Negative for nausea, vomiting and abdominal pain.  Musculoskeletal: Negative for neck pain and neck stiffness.  Skin: Positive for rash and wound.  All other systems reviewed and are negative.     Allergies  Review of patient's allergies indicates no known allergies.  Home Medications   Prior to Admission medications   Medication Sig Start Date End Date Taking? Authorizing Provider  clindamycin (CLEOCIN) 300 MG capsule Take 1 capsule (300 mg total) by mouth 3 (three) times daily. 12/16/14   Waldemar Dickens, MD   BP 103/74 mmHg  Pulse 84  Temp(Src) 98.2 F (36.8 C) (Oral)  Resp 18  Ht 5\' 8"  (1.727 m)  Wt 218 lb (98.884 kg)  BMI 33.15 kg/m2  SpO2 99% Physical Exam  Constitutional: He is oriented to person, place, and time. He appears well-developed and well-nourished. No distress.  HENT:  Head: Normocephalic and atraumatic.  Eyes: Conjunctivae and EOM are normal.  Neck: Normal range of motion.  Cardiovascular: Normal rate.   Pulmonary/Chest: Effort normal. No respiratory distress.  Abdominal: Soft. He exhibits no distension. There is no tenderness. There is no rebound.  Musculoskeletal: Normal range of motion.  Neurological:  He is alert and oriented to person, place, and time. Coordination normal.  Skin: Skin is warm and dry.  Erythema and warmth of the right anterior thigh that extends to the right medial thigh and just proximal to the right knee.  No central fluctuance.  There is generalized tenderness and a small scab over the right anterior thigh without drainage.  No red streaking noted and no right inguinal involvement.   Psychiatric: He has a normal mood and affect. His behavior is normal.  Nursing note and vitals reviewed.   ED Course  Procedures (including critical care  time) DIAGNOSTIC STUDIES: Oxygen Saturation is 99% on RA, normal by my interpretation.    COORDINATION OF CARE: 10:28 AM Discussed treatment plan with patient at beside, the patient agrees with the plan and has no further questions at this time.   Labs Review Labs Reviewed - No data to display  Imaging Review Dg Chest 2 View  02/03/2015   CLINICAL DATA:  Upper respiratory infection. Abscess of the right thigh.  EXAM: CHEST - 2 VIEW  COMPARISON:  None.  FINDINGS: The heart size and mediastinal contours are within normal limits. Both lungs are clear. The visualized skeletal structures are unremarkable.  IMPRESSION: No active disease.   Electronically Signed   By: San Morelle M.D.   On: 02/03/2015 10:23     EKG Interpretation None      MDM   Final diagnoses:  Cellulitis of right thigh   10:44 AM Patient has cellulitis of right anterior thigh without signs of systemic infection. Patient's vitals stable and patient afebrile. Patient will have Clindamycin and vicodin for symptoms. Patient will have cellulitic area marked with surgical marker. Patient instructed to return in 2 days for recheck and return sooner if redness extends past the mark or if he has fever, nausea, vomiting, or any other worsening or concerning symptoms. Patient understands and is agreeable to plan.   I personally performed the services described in this documentation, which was scribed in my presence. The recorded information has been reviewed and is accurate.    Alvina Chou, PA-C 02/03/15 [place], MD 02/03/15 1101

## 2019-12-24 ENCOUNTER — Ambulatory Visit: Payer: BLUE CROSS/BLUE SHIELD | Attending: Internal Medicine

## 2021-04-17 ENCOUNTER — Ambulatory Visit (INDEPENDENT_AMBULATORY_CARE_PROVIDER_SITE_OTHER): Payer: 59

## 2021-04-17 ENCOUNTER — Encounter: Payer: Self-pay | Admitting: Emergency Medicine

## 2021-04-17 ENCOUNTER — Ambulatory Visit
Admission: EM | Admit: 2021-04-17 | Discharge: 2021-04-17 | Disposition: A | Discharge status: Home / Self Care | Payer: 59 | Attending: Family Medicine | Admitting: Family Medicine

## 2021-04-17 DIAGNOSIS — S6721XA Crushing injury of right hand, initial encounter: Secondary | ICD-10-CM

## 2021-04-17 DIAGNOSIS — S62630A Displaced fracture of distal phalanx of right index finger, initial encounter for closed fracture: Secondary | ICD-10-CM | POA: Diagnosis not present

## 2021-04-17 MED ORDER — IBUPROFEN 800 MG PO TABS: 800.0000 mg | ORAL_TABLET | Freq: Two times a day (BID) | ORAL | 0 refills | Status: DC | PRN

## 2021-04-17 NOTE — Discharge Instructions (Addendum)
Keep splint in place at all times except with bathing.

## 2021-04-17 NOTE — ED Provider Notes (Signed)
[facility]    CSN: [id number] Arrival date & time: 04/17/21  1839      History   Chief Complaint No chief complaint on file.   HPI Daniel [name] is a 55 y.o. male.   HPI Patient presents today for evaluation of right index finger injury.  He reports that his finger was smashed between 2 boards earlier today.  As the day progressed right index finger has become more swollen and discolored at the DIP region of the finger.  He is unable to move the finger without experiencing severe pain.  Denies any prior injury to digit.  He endorses intact sensation of the right index finger. Past Medical History:  Diagnosis Date   Abdominal cyst    Brain tumor ([place])    GERD (gastroesophageal reflux disease)    Renal disorder    VHL (von Hippel-Lindau syndrome) [facility])     Patient Active Problem List   Diagnosis Date Noted   Rectal bleeding 04/28/2012    Past Surgical History:  Procedure Laterality Date   BRAIN TUMOR EXCISION  2001   COLONOSCOPY  06/30/2007   area of transverse colon appearing to be healing ulcerated lesion, benign/tubular adenoma / Approximately 5 cm of the distal terminal ileum was visualized/  Small internal hemorrhoids   COLONOSCOPY  05/29/2012   Procedure: COLONOSCOPY;  Surgeon: Danie Binder, MD;  Location: [facility];  Service: Endoscopy;  Laterality: N/A;  11:30   KIDNEY SURGERY         Home Medications    Prior to Admission medications   Medication Sig Start Date End Date Taking? Authorizing Provider  ibuprofen (ADVIL) 800 MG tablet Take 1 tablet (800 mg total) by mouth 2 (two) times daily between meals as needed. 04/17/21  Yes Scot Jun, FNP  clindamycin (CLEOCIN) 150 MG capsule Take 2 capsules (300 mg total) by mouth 3 (three) times daily. May dispense as '150mg'$  capsules 02/03/15   Alvina Chou, PA-C  HYDROcodone-acetaminophen (NORCO/VICODIN) 5-325 MG per tablet Take 2 tablets by mouth every 4 (four) hours as needed. 02/03/15    Alvina Chou, PA-C    Family History Family History  Problem Relation Age of Onset   Healthy Mother    Diabetes Father    Colon cancer Neg Hx    Cancer Neg Hx    Heart failure Neg Hx    Hyperlipidemia Neg Hx     Social History Social History   Tobacco Use   Smoking status: Never   Smokeless tobacco: Never  Substance Use Topics   Alcohol use: Yes    Comment: occassional   Drug use: No     Allergies   Patient has no known allergies.   Review of Systems Review of Systems Pertinent negatives listed in HPI   Physical Exam Triage Vital Signs ED Triage Vitals [04/17/21 1936]  Enc Vitals Group     BP 134/84     Pulse Rate 77     Resp 16     Temp 98.6 F (37 C)     Temp Source Temporal     SpO2 97 %     Weight      Height      Head Circumference      Peak Flow      Pain Score 7     Pain Loc      Pain Edu?      Excl. in [place]?    No data found.  Updated Vital  Signs BP 134/84 (BP Location: Right Arm)   Pulse 77   Temp 98.6 F (37 C) (Temporal)   Resp 16   SpO2 97%   Visual Acuity Right Eye Distance:   Left Eye Distance:   Bilateral Distance:    Right Eye Near:   Left Eye Near:    Bilateral Near:     Physical Exam Constitutional:      Appearance: Normal appearance.  HENT:     Head: Normocephalic.  Cardiovascular:     Rate and Rhythm: Normal rate and regular rhythm.  Pulmonary:     Effort: Pulmonary effort is normal.     Breath sounds: Normal breath sounds.  Musculoskeletal:     Right hand: Normal capillary refill.       Arms:  Skin:    Capillary Refill: Capillary refill takes less than 2 seconds.  Neurological:     General: No focal deficit present.     Mental Status: He is alert and oriented to person, place, and time.  Psychiatric:        Mood and Affect: Mood normal.        Behavior: Behavior normal. Behavior is cooperative.        Thought Content: Thought content normal.        Judgment: Judgment normal.        UC  Treatments / Results  Labs (all labs ordered are listed, but only abnormal results are displayed) Labs Reviewed - No data to display  EKG   Radiology No results found.  Procedures Procedures (including critical care time)  Medications Ordered in UC Medications - No data to display  Initial Impression / Assessment and Plan / UC Course  I have reviewed the triage vital signs and the nursing notes.  Pertinent labs & imaging results that were available during my care of the patient were reviewed by me and considered in my medical decision making (see chart for details).    Distal phalanx fracture involving the right index finger. Finger splinted.  Patient advised to follow-up with hand specialist by contacting their office tomorrow.  Continue Tylenol and ibuprofen as needed for pain.  Return precautions as needed. Final Clinical Impressions(s) / UC Diagnoses   Final diagnoses:  Closed displaced fracture of distal phalanx of right index finger, initial encounter     Discharge Instructions      Keep splint in place at all times except with bathing.     ED Prescriptions     Medication Sig Dispense Auth. Provider   ibuprofen (ADVIL) 800 MG tablet Take 1 tablet (800 mg total) by mouth 2 (two) times daily between meals as needed. 20 tablet Scot Jun, FNP      PDMP not reviewed this encounter.   Scot Jun, [place] 04/24/21 [phone]

## 2021-04-17 NOTE — ED Triage Notes (Signed)
Patient states he smashed his right index finger between 2 boards today.  Finger bruised and swollen

## 2021-09-13 ENCOUNTER — Ambulatory Visit
Admission: EM | Admit: 2021-09-13 | Discharge: 2021-09-13 | Disposition: A | Discharge status: Home / Self Care | Payer: 59 | Attending: Family Medicine | Admitting: Family Medicine

## 2021-09-13 ENCOUNTER — Other Ambulatory Visit: Payer: Self-pay

## 2021-09-13 DIAGNOSIS — H6122 Impacted cerumen, left ear: Secondary | ICD-10-CM | POA: Diagnosis not present

## 2021-09-13 DIAGNOSIS — H6591 Unspecified nonsuppurative otitis media, right ear: Secondary | ICD-10-CM | POA: Diagnosis not present

## 2021-09-13 MED ORDER — CARBAMIDE PEROXIDE 6.5 % OT SOLN: 5.0000 [drp] | Freq: Two times a day (BID) | OTIC | 0 refills | Status: DC

## 2021-09-13 MED ORDER — FLUTICASONE PROPIONATE 50 MCG/ACT NA SUSP: 1.0000 | Freq: Two times a day (BID) | NASAL | 2 refills | Status: DC

## 2021-09-13 NOTE — ED Triage Notes (Signed)
Patient states he can not hear out of his left ear for a week.  Patient states his hearing in both ears have been bad  He states his left ear is sore from using a Qtip.   Denies Fever  Denies Meds

## 2021-09-13 NOTE — ED Provider Notes (Signed)
[facility]    CSN: [id number] Arrival date & time: 09/13/21  1751      History   Chief Complaint Chief Complaint  Patient presents with   Ear Fullness    Left ear stopped up    HPI Daniel [name] is a 56 y.o. male.   Presenting today with muffled hearing and ear fullness in the left ear, now some pressure and fullness in the right ear as well.  States he has been dealing in the left ear with a Q-tip and thinks he may have stuck it too far in and now has some discomfort in the ear.  Denies drainage, headaches, recent illness, complete loss of hearing.   Past Medical History:  Diagnosis Date   Abdominal cyst    Brain tumor ([place])    GERD (gastroesophageal reflux disease)    Renal disorder    VHL (von Hippel-Lindau syndrome)     Patient Active Problem List   Diagnosis Date Noted   Rectal bleeding 04/28/2012    Past Surgical History:  Procedure Laterality Date   BRAIN TUMOR EXCISION  2001   COLONOSCOPY  06/30/2007   area of transverse colon appearing to be healing ulcerated lesion, benign/tubular adenoma / Approximately 5 cm of the distal terminal ileum was visualized/  Small internal hemorrhoids   COLONOSCOPY  05/29/2012   Procedure: COLONOSCOPY;  Surgeon: Danie Binder, MD;  [facility];  Service: Endoscopy;  Laterality: N/A;  11:30   KIDNEY SURGERY         Home Medications    Prior to Admission medications   Medication Sig Start Date End Date Taking? Authorizing Provider  carbamide peroxide (DEBROX) 6.5 % OTIC solution Place 5 drops into the left ear 2 (two) times daily. 09/13/21  Yes Volney American, PA-C  fluticasone [facility]) 50 MCG/ACT nasal spray Place 1 spray into both nostrils 2 (two) times daily. 09/13/21  Yes Volney American, PA-C  clindamycin (CLEOCIN) 150 MG capsule Take 2 capsules (300 mg total) by mouth 3 (three) times daily. May dispense as 150mg  capsules 02/03/15   Alvina Chou, PA-C   HYDROcodone-acetaminophen (NORCO/VICODIN) 5-325 MG per tablet Take 2 tablets by mouth every 4 (four) hours as needed. 02/03/15   Szekalski, Kaitlyn, PA-C  ibuprofen (ADVIL) 800 MG tablet Take 1 tablet (800 mg total) by mouth 2 (two) times daily between meals as needed. 04/17/21   Scot Jun, FNP    Family History Family History  Problem Relation Age of Onset   Healthy Mother    Diabetes Father    Colon cancer Neg Hx    Cancer Neg Hx    Heart failure Neg Hx    Hyperlipidemia Neg Hx     Social History Social History   Tobacco Use   Smoking status: Never   Smokeless tobacco: Never  Vaping Use   Vaping Use: Never used  Substance Use Topics   Alcohol use: Yes    Comment: occassional   Drug use: No     Allergies   Patient has no known allergies.   Review of Systems Review of Systems Per HPI  Physical Exam Triage Vital Signs ED Triage Vitals  Enc Vitals Group     BP 09/13/21 1915 (!) 163/96     Pulse Rate 09/13/21 1915 80     Resp 09/13/21 1915 18     Temp 09/13/21 1915 98.5 F (36.9 C)     Temp Source 09/13/21 1915 Oral  SpO2 09/13/21 1915 97 %     Weight --      Height --      Head Circumference --      Peak Flow --      Pain Score 09/13/21 1916 5     Pain Loc --      Pain Edu? --      Excl. in [place]? --    No data found.  Updated Vital Signs BP (!) 163/96 (BP Location: Right Arm)    Pulse 80    Temp 98.5 F (36.9 C) (Oral)    Resp 18    SpO2 97%   Visual Acuity Right Eye Distance:   Left Eye Distance:   Bilateral Distance:    Right Eye Near:   Left Eye Near:    Bilateral Near:     Physical Exam Vitals and nursing note reviewed.  Constitutional:      Appearance: Normal appearance.  HENT:     Head: Atraumatic.     Ears:     Comments: Right middle ear effusion, left cerumen impaction    Nose: Nose normal.     Mouth/Throat:     Mouth: Mucous membranes are moist.  Eyes:     Extraocular Movements: Extraocular movements intact.      Conjunctiva/sclera: Conjunctivae normal.  Cardiovascular:     Rate and Rhythm: Normal rate and regular rhythm.  Pulmonary:     Effort: Pulmonary effort is normal.     Breath sounds: Normal breath sounds.  Musculoskeletal:        General: Normal range of motion.     Cervical back: Normal range of motion and neck supple.  Skin:    General: Skin is warm and dry.  Neurological:     General: No focal deficit present.     Mental Status: He is oriented to person, place, and time.  Psychiatric:        Mood and Affect: Mood normal.        Thought Content: Thought content normal.        Judgment: Judgment normal.     UC Treatments / Results  Labs (all labs ordered are listed, but only abnormal results are displayed) Labs Reviewed - No data to display  EKG   Radiology No results found.  Procedures Procedures (including critical care time)  Medications Ordered in UC Medications - No data to display  Initial Impression / Assessment and Plan / UC Course  I have reviewed the triage vital signs and the nursing notes.  Pertinent labs & imaging results that were available during my care of the patient were reviewed by me and considered in my medical decision making (see chart for details).     Ear lavage performed with warm water and peroxide with partial clearance of cerumen impaction left ear.  TM not able to be visualized.  Debrox drops sent, discussed home lavage and to follow-up if not resolving.  Flonase, decongestants as needed for the effusion of the right ear.  Final Clinical Impressions(s) / UC Diagnoses   Final diagnoses:  Impacted cerumen of left ear  Middle ear effusion, right   Discharge Instructions   None    ED Prescriptions     Medication Sig Dispense Auth. Provider   carbamide peroxide (DEBROX) 6.5 % OTIC solution Place 5 drops into the left ear 2 (two) times daily. 15 mL Volney American, PA-C   fluticasone [facility]) 50 MCG/ACT nasal spray Place 1  spray into both  nostrils 2 (two) times daily. 16 g Volney American, [place]      PDMP not reviewed this encounter.   Volney American, [place] 09/13/21 [phone]

## 2021-09-13 NOTE — ED Notes (Signed)
Patient had a left ear lavage. Patient tolerated it ok but he states he can still feel the bubbling from the peroxide.

## 2021-10-18 ENCOUNTER — Other Ambulatory Visit: Payer: Self-pay | Admitting: Nurse Practitioner

## 2021-10-18 ENCOUNTER — Other Ambulatory Visit (HOSPITAL_COMMUNITY): Payer: Self-pay | Admitting: Nurse Practitioner

## 2021-10-18 DIAGNOSIS — K429 Umbilical hernia without obstruction or gangrene: Secondary | ICD-10-CM

## 2021-11-01 ENCOUNTER — Other Ambulatory Visit: Payer: Self-pay

## 2021-11-01 ENCOUNTER — Ambulatory Visit (HOSPITAL_COMMUNITY)
Admission: RE | Admit: 2021-11-01 | Discharge: 2021-11-01 | Disposition: A | Discharge status: Home / Self Care | Payer: 59 | Source: Ambulatory Visit | Attending: Nurse Practitioner | Admitting: Nurse Practitioner

## 2021-11-01 DIAGNOSIS — K429 Umbilical hernia without obstruction or gangrene: Secondary | ICD-10-CM | POA: Insufficient documentation

## 2021-11-14 ENCOUNTER — Other Ambulatory Visit: Payer: Self-pay

## 2021-11-14 ENCOUNTER — Encounter: Payer: Self-pay | Admitting: General Surgery

## 2021-11-14 ENCOUNTER — Ambulatory Visit (INDEPENDENT_AMBULATORY_CARE_PROVIDER_SITE_OTHER): Payer: 59 | Admitting: General Surgery

## 2021-11-14 VITALS — BP 122/84 | HR 58 | Temp 98.2°F | Resp 12 | Ht 68.0 in | Wt 223.0 lb

## 2021-11-14 DIAGNOSIS — K432 Incisional hernia without obstruction or gangrene: Secondary | ICD-10-CM | POA: Diagnosis not present

## 2021-11-14 NOTE — Progress Notes (Signed)
[facility] History and Physical ? ?Reason for Referral: Periumbilical hernia  ?Referring Physician:Brandi Kenton Daniel [name]  ? ? ?Chief Complaint   ?New Daniel [name] (Initial Visit) ?  ? ? ?Daniel Daniel [name] is a 56 y.o. male.  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 ?Daniel Daniel [name] was seen in by Dr. Karenann Cai, [name] a Geneticist 09/2006 after repeat imaging demonstrated no recurrence of his brain tumor.  At that visit she recommended CT abdomen, MRI spine and brain and 24 hour urine to screen for any recurrence. Daniel Daniel [name] is now out 15 years from that and I cannot find where Daniel Daniel [name] ever had follow up on his VHL.  ? ?Daniel Daniel [name] has not had any workup regarding this VHL since that time based on what I am seeing. Daniel Daniel [name] was referred to me for his periumbilical hernia.  ? ?Based on Daniel screening for VHL and fact that Daniel Daniel [name] has actually had a brain tumor and renal cancer, Daniel Daniel [name] should be undergoing screening regularly. PCP note does not indicate that this is Daniel case nor does it mention Daniel VHL. Daniel Daniel [name] was just establishing with Daniel Daniel [name] at Daniel visit prior to his referral and their notes indicate Daniel Daniel [name] had not had a PCP since 2002.  ? ?Daniel Daniel [name] had an [place] limited of his abdomen done before seeing me and this showed fat in a periumbilical hernia.  ? ? ?Past Medical History:  ?Diagnosis Date  ? Abdominal cyst   ? Brain tumor ([place])   ? GERD (gastroesophageal reflux disease)   ? Renal disorder   ? VHL (von Hippel-Lindau syndrome)   ? ? ? ?Past Surgical History:  ?Procedure Laterality Date  ? BRAIN TUMOR EXCISION  2001  ? COLONOSCOPY  06/30/2007  ? area of transverse colon appearing to be healing ulcerated lesion,  benign/tubular adenoma / Approximately 5 cm of Daniel distal terminal ileum was visualized/  Small internal hemorrhoids  ? COLONOSCOPY  05/29/2012  ? Procedure: COLONOSCOPY;  Surgeon: Daniel Daniel [name];  [facility];  Service: Endoscopy;  Laterality: N/A;  11:30  ? KIDNEY SURGERY    ? ? ?Family History  ?Problem Relation Age of Onset  ? Healthy Mother   ? Diabetes Father   ? Colon cancer Neg Hx   ? Cancer Neg Hx   ? Heart failure Neg Hx   ? Hyperlipidemia Neg Hx   ? ? ?Social History  ? ?Tobacco Use  ? Smoking status: Never  ? Smokeless tobacco: Never  ?Vaping Use  ? Vaping Use: Never used  ?Substance Use Topics  ? Alcohol use: Yes  ?  Comment: occassional  ? Drug use: No  ? ? ?Medications: I have reviewed Daniel Daniel [name]'s current medications. ?Allergies as of 11/14/2021   ?No Known Allergies ?  ? ?  ?Medication List  ?  ? ?  ? Accurate as of November 14, 2021  9:25 AM. If you have any questions, ask your nurse or doctor.  ?  ?  ? ?  ? ?STOP taking these medications   ? ?carbamide peroxide 6.5 % OTIC solution ?Commonly known as: DEBROX ?Stopped by: Daniel Cagey, [name] ?  ?clindamycin 150 MG  capsule ?Commonly known as: CLEOCIN ?Stopped by: Daniel Cagey, [name] ?  ?fluticasone 50 MCG/ACT nasal spray ?Commonly known as: FLONASE ?Stopped by: Daniel Cagey, [name] ?  ?HYDROcodone-acetaminophen 5-325 MG tablet ?Commonly known as: NORCO/VICODIN ?Stopped by: Daniel Cagey, [name] ?  ?ibuprofen 800 MG tablet ?Commonly known as: ADVIL ?Stopped by: Daniel Cagey, [name] ?  ? ?  ? ?TAKE these medications   ? ?atorvastatin 20 MG tablet ?Commonly known as: LIPITOR ?atorvastatin 20 mg tablet ? TAKE 1 TABLET BY MOUTH EVERY DAY AT BEDTIME ?  ?metFORMIN 500 MG tablet ?Commonly known as: GLUCOPHAGE ?metformin 500 mg tablet ? TAKE 1 TABLET BY MOUTH EVERY DAY ?  ? ?  ? ? ? ?ROS:  ?A comprehensive review of systems was negative except for: Respiratory: positive for SOB ?Gastrointestinal: positive for abdominal pain and hernia   ? ?Blood pressure 122/84, pulse (!) 58, temperature 98.2 ?F (36.8 ?C), temperature source Other (Comment), resp. rate 12, height '5\' 8"'$  (1.727 m), weight 223 lb (101.2 kg), SpO2 95 %. ?Physical Exam ?Vitals reviewed.  ?Constitutional:   ?   Appearance: Daniel Daniel [name] is obese.  ?HENT:  ?   Head: Normocephalic.  ?   Nose: Nose normal.  ?   Mouth/Throat:  ?   Mouth: Mucous membranes are moist.  ?Eyes:  ?   Extraocular Movements: Extraocular movements intact.  ?Cardiovascular:  ?   Rate and Rhythm: Normal rate.  ?Pulmonary:  ?   Effort: Pulmonary effort is normal.  ?Abdominal:  ?   General: Abdomen is protuberant. There is distension.  ?   Palpations: Abdomen is soft.  ?   Tenderness: There is abdominal tenderness.  ?   Comments: Daniel [name] with distended abdomen, periumbilical hernia, right of midline 3cm defect, partially reducible fat, located at top of prior incision  ?Musculoskeletal:     ?   General: No swelling.  ?   Cervical back: Normal range of motion.  ?Skin: ?   General: Skin is warm.  ?Neurological:  ?   Mental Status: Daniel Daniel [name] is alert.  ? ? ?Results: ?CLINICAL DATA:  Abdominal pain, possible hernia ?  ?EXAM: ?ULTRASOUND ABDOMEN LIMITED ?  ?COMPARISON:  None. ?  ?FINDINGS: ?Scanning in Daniel area of clinical concern shows evidence of a ?supraumbilical hernia slightly eccentric to Daniel right. Daniel neck ?measures 1.5 x 2.6 cm. Omental fat is noted herniating through Daniel ?defect. No incarcerated bowel is noted. ?  ?IMPRESSION: ?Fat containing supraumbilical hernia. No definitive incarcerated ?bowel is noted. ?  ?  ?Electronically Signed ?  By: Daniel Daniel [name] M.D. ?  On: 11/02/2021 00:13 ? ? ? ?Assessment & Plan:  ?Daniel Daniel [name] is a 56 y.o. male with VHL that has not been getting Daniel appropriate annual screening based on documentation from [address] and fact that Daniel Daniel [name] just established with his PCP before this referral was made. Daniel Daniel [name] does have an incisional hernia with fat and this could be repaired, but we cannot repair this until  Daniel Daniel [name] gets his workup with regards to his VHL as Daniel Daniel [name] could have recurrent brain tumors, renal cancer, adrenal tumors, pancreatic tumors, all of which would alter his care and decision making.  ? ?Much of this I did not find out until reviewing Daniel Care Everyone more extensively after Daniel Daniel [name] left. We will notify Daniel Daniel [name] that any surgery for Daniel hernia is on hold until Daniel VHL screening is done (will need all of Daniel recommendations below). Will notify his PCP to get  this started.  ? ?Up To Date Recommendations: ?Dilated retinal examination Before 1 year Every 6 to 12 months Annually after age 79  ?History and physical examination by physician informed about VHL 1 year Every year    ?Blood pressure and pulse 2 years Every year    ?Metanephrines 5 years Every year Plasma free metanephrines preferred but 24-hour urine fractionated metanephrines can be used  ?MRI of Daniel brain and spine 11 years Every 2 years Performed with and without contrast (without contrast if pregnant) ?Can be coordinated with MRI of Daniel abdomen ?Include thin cuts through Daniel posterior fossa and petrous temple bone ?One-time MRI of Daniel internal auditory canal at age 86 years  ?Audiology 11 years Every 2 years    ?MRI of Daniel abdomen 15 years Every 2 years Performed with and without contrast (without contrast if pregnant) ?Assess kidneys, pancreas, and adrenal glands ?Can be coordinated with MRI of Daniel neuroaxis  ? ?All questions were answered to Daniel satisfaction of Daniel Daniel [name] and family. ? ? ?I spent over 60 minutes reviewing Daniel chart and looking over Daniel information regarding Daniel Daniel [name] to make Daniel plan above.  ? ?Daniel Daniel [name] ?11/14/2021, 9:25 AM  ? ? ? ? ? ?

## 2021-11-14 NOTE — Patient Instructions (Signed)
Laparoscopic Ventral Hernia Repair Laparoscopic ventral hernia repairis a procedure to fix a bulge of tissue that pushes through a weak area of muscle in the abdomen (ventral hernia). A ventral hernia may be: Above the belly button. This is called an epigastric hernia. At the belly button. This is called an umbilical hernia. At the incision site from previous abdominal surgery. This is called an incisional hernia. You may have this procedure as emergency surgery if part of your intestine gets trapped inside the hernia and starts to lose its blood supply (strangulation). Laparoscopic surgery is done through small incisions using a thin surgical telescope with a light and camera (laparoscope). During surgery, your surgeon will use images from the laparoscope to guide the procedure. A mesh screen will be placed in the hernia to close the opening and strengthen the abdominal wall. Tell a health care provider about: Any allergies you have. All medicines you are taking, including vitamins, herbs, eye drops, creams, and over-the-counter medicines. Any problems you or family members have had with anesthetic medicines. Any blood disorders you have. Any surgeries you have had. Any medical conditions you have. Whether you are pregnant or may be pregnant. What are the risks? Generally, this is a safe procedure. However, problems may occur, including: Infection. Bleeding. Damage to nearby structures or organs in the abdomen. Trouble urinating or having a bowel movement after surgery. Blood clots. The hernia coming back after surgery. Fluid buildup in the area of the hernia. In some cases, your health care provider may need to switch from a laparoscopic procedure to a procedure that is done through a single, larger incision in the abdomen (open procedure). You may need an open procedure if: You have a hernia that is difficult to repair. Your organs are hard to see with the laparoscope. You have bleeding  problems during the laparoscopic procedure. What happens before the procedure? Medicines Ask your health care provider about: Changing or stopping your regular medicines. This is especially important if you are taking diabetes medicines or blood thinners. Taking medicines such as aspirin and ibuprofen. These medicines can thin your blood. Do not take these medicines unless your health care provider tells you to take them. Taking over-the-counter medicines, vitamins, herbs, and supplements. Tests You may need to have tests before the procedure, such as: Blood tests. Urine tests. Abdominal ultrasound. Chest X-ray. Electrocardiogram (ECG). General instructions You may be asked to take a laxative or do an enema to empty your bowel before surgery (bowel prep). Do not use any products that contain nicotine or tobacco for at least 4 weeks before the procedure. These products include cigarettes, chewing tobacco, and vaping devices, such as e-cigarettes. If you need help quitting, ask your health care provider. Ask your health care provider: How your surgery site will be marked. What steps will be taken to help prevent infection. These steps may include: Removing hair at the surgery site. Washing skin with a germ-killing soap. Receiving antibiotic medicine. Plan to have a responsible adult take you home from the hospital or clinic. If you will be going home right after the procedure, plan to have a responsible adult care for you for the time you are told. This is important. What happens during the procedure?  An IV will be inserted into one of your veins. You will be given one or more of the following: A medicine to help you relax (sedative). A medicine to numb the area (local anesthetic). A medicine to make you fall asleep (general anesthetic).   A small incision will be made in your abdomen. A hollow metal tube (trocar) will be placed through the incision. A tube will be placed through the  trocar to inflate your abdomen with carbon dioxide. This makes it easier for your surgeon to see inside your abdomen during the repair. A laparoscope will be inserted into your abdomen through the trocar. The laparoscope will send images to a monitor in the operating room. Other trocars will be put through other small incisions in your abdomen. The surgical instruments needed for the procedure will be placed through these trocars. The tissue or intestines that make up the hernia will be moved back into place. The edges of the hernia may be stitched (sutured) together. A piece of mesh will be used to close the hernia. Sutures, clips, or staples will be used to keep the mesh in place. A bandage (dressing) or skin glue will be put over the incisions. The procedure may vary among health care providers and hospitals. What happens after the procedure? Your blood pressure, heart rate, breathing rate, and blood oxygen level will be monitored until you leave the hospital or clinic. You will continue to receive fluids and medicines through an IV. Your IV will be removed when you can drink clear fluids. You will be given pain medicine as needed. You will be encouraged to get up and walk around as soon as possible. You will be shown how to do deep breathing exercises to help prevent a lung infection. If you were given a sedative during the procedure, it can affect you for several hours. Do not drive or operate machinery until your health care provider says that it is safe. Summary Laparoscopic ventral hernia is an operation to fix a hernia using small incisions. Tell your health care provider about other medical conditions that you have and about all the medicines that you are taking. Follow instructions from your health care provider about eating and drinking before the procedure. Plan to have a responsible adult take you home from the hospital or clinic. After the procedure, you will be encouraged to walk  as soon as possible. You will also be taught how to do deep breathing exercises. This information is not intended to replace advice given to you by your health care provider. Make sure you discuss any questions you have with your health care provider. Document Revised: 04/15/2020 Document Reviewed: 04/15/2020 Elsevier Patient Education  2022 Elsevier Inc.  

## 2021-11-15 ENCOUNTER — Telehealth: Payer: Self-pay | Admitting: *Deleted

## 2021-11-15 ENCOUNTER — Encounter: Payer: Self-pay | Admitting: *Deleted

## 2021-11-15 ENCOUNTER — Encounter: Payer: Self-pay | Admitting: General Surgery

## 2021-11-15 NOTE — Telephone Encounter (Signed)
Call placed to PCP and discussed case with Bree.  ?

## 2021-11-15 NOTE — Telephone Encounter (Signed)
Call placed to patient to follow up appointment on 11/14/2021.  ? ?Patient has not had follow up of Von Hippel-Lindau Syndrome since 2008. Patient required to complete recommended work up with PCP prior to any surgical intervention.  ? ?Call placed to patient and patient made aware. Verbalized understanding.  ? ?Chart notes faxed to PCP for review. Call placed to PCP to discuss. [place].  ?

## 2021-12-19 ENCOUNTER — Other Ambulatory Visit (HOSPITAL_COMMUNITY): Payer: Self-pay | Admitting: Nurse Practitioner

## 2021-12-19 ENCOUNTER — Other Ambulatory Visit: Payer: Self-pay | Admitting: Nurse Practitioner

## 2021-12-19 DIAGNOSIS — Z86011 Personal history of benign neoplasm of the brain: Secondary | ICD-10-CM

## 2021-12-21 ENCOUNTER — Encounter: Payer: Self-pay | Admitting: Internal Medicine

## 2021-12-21 ENCOUNTER — Telehealth: Payer: Self-pay | Admitting: Internal Medicine

## 2022-01-16 ENCOUNTER — Ambulatory Visit: Payer: 59

## 2022-01-18 ENCOUNTER — Other Ambulatory Visit: Payer: Self-pay

## 2022-01-18 ENCOUNTER — Ambulatory Visit: Payer: 59 | Admitting: Internal Medicine

## 2022-01-18 ENCOUNTER — Encounter: Payer: Self-pay | Admitting: Internal Medicine

## 2022-01-18 VITALS — BP 118/76 | HR 85 | Temp 96.9°F | Ht 67.0 in | Wt 222.4 lb

## 2022-01-18 DIAGNOSIS — K5900 Constipation, unspecified: Secondary | ICD-10-CM | POA: Diagnosis not present

## 2022-01-18 DIAGNOSIS — Z1211 Encounter for screening for malignant neoplasm of colon: Secondary | ICD-10-CM | POA: Diagnosis not present

## 2022-01-18 MED ORDER — PEG 3350-KCL-NA BICARB-NACL 420 G PO SOLR: 4000.0000 mL | ORAL | 0 refills | Status: DC

## 2022-01-18 NOTE — H&P (View-Only) (Signed)
Primary Care Physician:  Celene Squibb, MD Primary Gastroenterologist:  Dr. Abbey Chatters  Chief complaint: Due for colonoscopy   HPI:   Daniel [name] is a 57 y.o. male who presents to clinic today by referral from his PCP Dr. Nevada Crane for evaluation.  Due for screening colonoscopy.  Last colonoscopy 2013 with 1 small polyp removed.  Pathology showed this was benign.  No family history of colorectal malignancy.  No abdominal pain no unintentional weight loss.  Notes 1 episode of rectal bleeding 1 to 2 months ago after straining.  No episodes since.  Does note mild constipation.  Has bowel movements once every 2 to 3 days.  Does feel as though he gets backed up at times.  Has not taken anything for this.  Denies any upper GI symptoms including heartburn, reflux, dysphagia/odynophagia.  No epigastric or chest pain.  Past Medical History:  Diagnosis Date   Abdominal cyst    Brain tumor ([place])    cerebellar hemangioblastoma   GERD (gastroesophageal reflux disease)    Renal disorder    VHL (von Hippel-Lindau syndrome) [facility])     Past Surgical History:  Procedure Laterality Date   BRAIN TUMOR EXCISION  09/11/1999   COLONOSCOPY  06/30/2007   area of transverse colon appearing to be healing ulcerated lesion, benign/tubular adenoma / Approximately 5 cm of the distal terminal ileum was visualized/  Small internal hemorrhoids   COLONOSCOPY  05/29/2012   Procedure: COLONOSCOPY;  Surgeon: Danie Binder, MD;  Location[facility];  Service: Endoscopy;  Laterality: N/A;  11:30   KIDNEY SURGERY     NEPHRECTOMY RADICAL Right 01/2006   [facility] 1cm well differentiated renal cell cancer    No current outpatient medications on file.   No current facility-administered medications for this visit.    Allergies as of 01/18/2022   (No Known Allergies)    Family History  Problem Relation Age of Onset   Healthy Mother    Diabetes Father    Colon cancer Neg Hx    Cancer Neg Hx    Heart failure Neg Hx     Hyperlipidemia Neg Hx     Social History   Socioeconomic History   Marital status: Single    Spouse name: Not on file   Number of children: Not on file   Years of education: Not on file   Highest education level: Not on file  Occupational History   Not on file  Tobacco Use   Smoking status: Never   Smokeless tobacco: Never  Vaping Use   Vaping Use: Never used  Substance and Sexual Activity   Alcohol use: Yes    Comment: occassional   Drug use: No   Sexual activity: Not Currently  Other Topics Concern   Not on file  Social History Narrative   Not on file   Social Determinants of Health   Financial Resource Strain: Not on file  Food Insecurity: Not on file  Transportation Needs: Not on file  Physical Activity: Not on file  Stress: Not on file  Social Connections: Not on file  Intimate Partner Violence: Not on file    Subjective: Review of Systems  Constitutional:  Negative for chills and fever.  HENT:  Negative for congestion and hearing loss.   Eyes:  Negative for blurred vision and double vision.  Respiratory:  Negative for cough and shortness of breath.   Cardiovascular:  Negative for chest pain and palpitations.  Gastrointestinal:  Negative for  abdominal pain, blood in stool, constipation, diarrhea, heartburn, melena and vomiting.  Genitourinary:  Negative for dysuria and urgency.  Musculoskeletal:  Negative for joint pain and myalgias.  Skin:  Negative for itching and rash.  Neurological:  Negative for dizziness and headaches.  Psychiatric/Behavioral:  Negative for depression. The patient is not nervous/anxious.       Objective: BP 118/76   Pulse 85   Temp (!) 96.9 F (36.1 C) (Oral)   Ht '5\' 7"'$  (1.702 m)   Wt 222 lb 6.4 oz (100.9 kg)   BMI 34.83 kg/m  Physical Exam Constitutional:      Appearance: Normal appearance.  HENT:     Head: Normocephalic and atraumatic.  Eyes:     Extraocular Movements: Extraocular movements intact.      Conjunctiva/sclera: Conjunctivae normal.  Cardiovascular:     Rate and Rhythm: Normal rate and regular rhythm.  Pulmonary:     Effort: Pulmonary effort is normal.     Breath sounds: Normal breath sounds.  Abdominal:     General: Bowel sounds are normal.     Palpations: Abdomen is soft.  Musculoskeletal:        General: Normal range of motion.     Cervical back: Normal range of motion and neck supple.  Skin:    General: Skin is warm.  Neurological:     General: No focal deficit present.     Mental Status: He is alert and oriented to person, place, and time.  Psychiatric:        Mood and Affect: Mood normal.        Behavior: Behavior normal.     Assessment: *Colon cancer screening *Constipation-mild, intermitted  Plan: Will schedule for screening colonoscopy.The risks including infection, bleed, or perforation as well as benefits, limitations, alternatives and imponderables have been reviewed with the patient. Questions have been answered. All parties agreeable.  For patient's constipation, I recommended taking MiraLAX 1 capful daily.  If this is not adequate then increase to 2 capfuls daily.  If this is still not adequate then add on once daily Dulcolax.  Also recommended patient start taking fiber therapy.  Print out given to patient today.  Encouraged to drink at least 6 glasses of water daily.   Thank you Dr. Nevada Crane for the kind referral  01/18/2022 3:02 PM   Disclaimer: This note was dictated with voice recognition software. Similar sounding words can inadvertently be transcribed and may not be corrected upon review.

## 2022-01-18 NOTE — Patient Instructions (Signed)
We will schedule you for colonoscopy for colon cancer screening purposes. ? ?For your constipation, I want you to start taking over the counter MiraLAX 1 capful daily.  If this does not adequately control your constipation, I would increase to 2 capfuls daily.  If this is still not adequate, then I would add on once daily Dulcolax (bisacodyl) tablet. ?  ?I also recommend increasing fiber in your diet or adding OTC Benefiber/Metamucil. Be sure to drink at least 4 to 6 glasses of water daily.  ? ?It was nice meeting both you today. ? ?Dr. Abbey Chatters ? ?At [facility] we value your feedback. You may receive a survey about your visit today. Please share your experience as we strive to create trusting relationships with our patients to provide genuine, compassionate, quality care. ? ?We appreciate your understanding and patience as we review any laboratory studies, imaging, and other diagnostic tests that are ordered as we care for you. Our office policy is 5 business days for review of these results, and any emergent or urgent results are addressed in a timely manner for your best interest. If you do not hear from our office in 1 week, please contact Daniel [name].  ? ?We also encourage the use of MyChart, which contains your medical information for your review as well. If you are not enrolled in this feature, an access code is on this after visit summary for your convenience. Thank you for allowing Daniel [name] to be involved in your care. ? ?It was great to see you today!  I hope you have a great rest of your Spring! ? ? ? ?Elon Alas. Abbey Chatters, D.O. ?Gastroenterology and Hepatology ?[facility] ? ? ?

## 2022-01-18 NOTE — Progress Notes (Signed)
? ? ?Primary Care Physician:  Celene Squibb, MD ?Primary Gastroenterologist:  Dr. Abbey Chatters ? ?Chief complaint: Due for colonoscopy ? ? ?HPI:   ?Daniel [name] is a 56 y.o. male who presents to clinic today by referral from his PCP Dr. Nevada Crane for evaluation.  Due for screening colonoscopy.  Last colonoscopy 2013 with 1 small polyp removed.  Pathology showed this was benign.  No family history of colorectal malignancy.  No abdominal pain no unintentional weight loss.  Notes 1 episode of rectal bleeding 1 to 2 months ago after straining.  No episodes since. ? ?Does note mild constipation.  Has bowel movements once every 2 to 3 days.  Does feel as though he gets backed up at times.  Has not taken anything for this. ? ?Denies any upper GI symptoms including heartburn, reflux, dysphagia/odynophagia.  No epigastric or chest pain. ? ?Past Medical History:  ?Diagnosis Date  ? Abdominal cyst   ? 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 GERD (gastroesophageal reflux disease)   ? Renal disorder   ? 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 ? ?Past Surgical History:  ?Procedure Laterality Date  ? BRAIN TUMOR EXCISION  09/11/1999  ? COLONOSCOPY  06/30/2007  ? area of transverse colon appearing to be healing ulcerated lesion, benign/tubular adenoma / Approximately 5 cm of the distal terminal ileum was visualized/  Small internal hemorrhoids  ? COLONOSCOPY  05/29/2012  ? Procedure: COLONOSCOPY;  Surgeon: Danie Binder, MD;  Location: [facility];  Service: Endoscopy;  Laterality: N/A;  11:30  ? KIDNEY SURGERY    ? NEPHRECTOMY RADICAL Right 01/2006  ? [facility] 1cm well differentiated renal cell cancer  ? ? ?No current outpatient medications on file.  ? ?No current facility-administered medications for this visit.  ? ? ?Allergies as of 01/18/2022  ? (No Known Allergies)  ? ? ?Family History  ?Problem Relation Age of Onset  ? Healthy Mother   ? Diabetes Father   ? Colon cancer Neg Hx   ? Cancer Neg Hx   ? Heart failure Neg Hx    ? Hyperlipidemia Neg Hx   ? ? ?Social History  ? ?Socioeconomic History  ? Marital status: Single  ?  Spouse name: Not on file  ? Number of children: Not on file  ? Years of education: Not on file  ? Highest education level: Not on file  ?Occupational History  ? Not on file  ?Tobacco Use  ? Smoking status: Never  ? Smokeless tobacco: Never  ?Vaping Use  ? Vaping Use: Never used  ?Substance and Sexual Activity  ? Alcohol use: Yes  ?  Comment: occassional  ? Drug use: No  ? Sexual activity: Not Currently  ?Other Topics Concern  ? Not on file  ?Social History Narrative  ? Not on file  ? ?Social Determinants of Health  ? ?Financial Resource Strain: Not on file  ?Food Insecurity: Not on file  ?Transportation Needs: Not on file  ?Physical Activity: Not on file  ?Stress: Not on file  ?Social Connections: Not on file  ?Intimate Partner Violence: Not on file  ? ? ?Subjective: ?Review of Systems  ?Constitutional:  Negative for chills and fever.  ?HENT:  Negative for congestion and hearing loss.   ?Eyes:  Negative for blurred vision and double vision.  ?Respiratory:  Negative for cough and shortness of breath.   ?Cardiovascular:  Negative for chest pain and palpitations.  ?Gastrointestinal:  Negative for  abdominal pain, blood in stool, constipation, diarrhea, heartburn, melena and vomiting.  ?Genitourinary:  Negative for dysuria and urgency.  ?Musculoskeletal:  Negative for joint pain and myalgias.  ?Skin:  Negative for itching and rash.  ?Neurological:  Negative for dizziness and headaches.  ?Psychiatric/Behavioral:  Negative for depression. The patient is not nervous/anxious.    ? ? ? ?Objective: ?BP 118/76   Pulse 85   Temp (!) 96.9 ?F (36.1 ?C) (Oral)   Ht '5\' 7"'$  (1.702 m)   Wt 222 lb 6.4 oz (100.9 kg)   BMI 34.83 kg/m?  ?Physical Exam ?Constitutional:   ?   Appearance: Normal appearance.  ?HENT:  ?   Head: Normocephalic and atraumatic.  ?Eyes:  ?   Extraocular Movements: Extraocular movements intact.  ?    Conjunctiva/sclera: Conjunctivae normal.  ?Cardiovascular:  ?   Rate and Rhythm: Normal rate and regular rhythm.  ?Pulmonary:  ?   Effort: Pulmonary effort is normal.  ?   Breath sounds: Normal breath sounds.  ?Abdominal:  ?   General: Bowel sounds are normal.  ?   Palpations: Abdomen is soft.  ?Musculoskeletal:     ?   General: Normal range of motion.  ?   Cervical back: Normal range of motion and neck supple.  ?Skin: ?   General: Skin is warm.  ?Neurological:  ?   General: No focal deficit present.  ?   Mental Status: He is alert and oriented to person, place, and time.  ?Psychiatric:     ?   Mood and Affect: Mood normal.     ?   Behavior: Behavior normal.  ? ? ? ?Assessment: ?*Colon cancer screening ?*Constipation-mild, intermitted ? ?Plan: ?Will schedule for screening colonoscopy.The risks including infection, bleed, or perforation as well as benefits, limitations, alternatives and imponderables have been reviewed with the patient. Questions have been answered. All parties agreeable. ? ?For patient's constipation, I recommended taking MiraLAX 1 capful daily.  If this is not adequate then increase to 2 capfuls daily.  If this is still not adequate then add on once daily Dulcolax. ? ?Also recommended patient start taking fiber therapy.  Print out given to patient today.  Encouraged to drink at least 6 glasses of water daily. ? ? ?Thank you Dr. Nevada Crane for the kind referral ? ?01/18/2022 3:02 PM ? ? ?Disclaimer: This note was dictated with voice recognition software. Similar sounding words can inadvertently be transcribed and may not be corrected upon review. ? ?

## 2022-02-12 ENCOUNTER — Other Ambulatory Visit (HOSPITAL_COMMUNITY): Payer: Self-pay | Admitting: Nephrology

## 2022-02-12 ENCOUNTER — Other Ambulatory Visit: Payer: Self-pay | Admitting: Nephrology

## 2022-02-12 DIAGNOSIS — Z905 Acquired absence of kidney: Secondary | ICD-10-CM

## 2022-02-12 DIAGNOSIS — C641 Malignant neoplasm of right kidney, except renal pelvis: Secondary | ICD-10-CM

## 2022-02-12 DIAGNOSIS — R718 Other abnormality of red blood cells: Secondary | ICD-10-CM

## 2022-02-12 DIAGNOSIS — E1122 Type 2 diabetes mellitus with diabetic chronic kidney disease: Secondary | ICD-10-CM

## 2022-02-13 ENCOUNTER — Other Ambulatory Visit: Payer: Self-pay

## 2022-02-13 ENCOUNTER — Ambulatory Visit (HOSPITAL_COMMUNITY)
Admission: RE | Admit: 2022-02-13 | Discharge: 2022-02-13 | Disposition: A | Discharge status: Home / Self Care | Payer: 59 | Attending: Internal Medicine | Admitting: Internal Medicine

## 2022-02-13 ENCOUNTER — Encounter (HOSPITAL_COMMUNITY)
Admission: RE | Disposition: A | Discharge status: Home / Self Care | Payer: Self-pay | Source: Home / Self Care | Attending: Internal Medicine

## 2022-02-13 ENCOUNTER — Ambulatory Visit (HOSPITAL_BASED_OUTPATIENT_CLINIC_OR_DEPARTMENT_OTHER): Payer: 59 | Admitting: Anesthesiology

## 2022-02-13 ENCOUNTER — Ambulatory Visit (HOSPITAL_COMMUNITY): Payer: 59 | Admitting: Anesthesiology

## 2022-02-13 ENCOUNTER — Encounter (HOSPITAL_COMMUNITY): Payer: Self-pay

## 2022-02-13 DIAGNOSIS — K635 Polyp of colon: Secondary | ICD-10-CM | POA: Diagnosis not present

## 2022-02-13 DIAGNOSIS — K5900 Constipation, unspecified: Secondary | ICD-10-CM | POA: Insufficient documentation

## 2022-02-13 DIAGNOSIS — K648 Other hemorrhoids: Secondary | ICD-10-CM | POA: Insufficient documentation

## 2022-02-13 DIAGNOSIS — K219 Gastro-esophageal reflux disease without esophagitis: Secondary | ICD-10-CM | POA: Insufficient documentation

## 2022-02-13 DIAGNOSIS — Z1211 Encounter for screening for malignant neoplasm of colon: Secondary | ICD-10-CM | POA: Diagnosis present

## 2022-02-13 DIAGNOSIS — D124 Benign neoplasm of descending colon: Secondary | ICD-10-CM | POA: Diagnosis not present

## 2022-02-13 HISTORY — PX: POLYPECTOMY: SHX5525

## 2022-02-13 HISTORY — PX: COLONOSCOPY WITH PROPOFOL: SHX5780

## 2022-02-13 SURGERY — COLONOSCOPY WITH PROPOFOL
Anesthesia: General

## 2022-02-13 MED ORDER — PHENYLEPHRINE 80 MCG/ML (10ML) SYRINGE FOR IV PUSH (FOR BLOOD PRESSURE SUPPORT)
PREFILLED_SYRINGE | INTRAVENOUS | Status: DC | PRN
  Administered 2022-02-13 (×3): 80 ug via INTRAVENOUS

## 2022-02-13 MED ORDER — PHENYLEPHRINE 80 MCG/ML (10ML) SYRINGE FOR IV PUSH (FOR BLOOD PRESSURE SUPPORT)
PREFILLED_SYRINGE | INTRAVENOUS | Status: AC
  Filled 2022-02-13: qty 10

## 2022-02-13 MED ORDER — LACTATED RINGERS IV SOLN: INTRAVENOUS | Status: DC

## 2022-02-13 MED ORDER — PROPOFOL 10 MG/ML IV BOLUS
INTRAVENOUS | Status: DC | PRN
  Administered 2022-02-13 (×2): 50 mg via INTRAVENOUS
  Administered 2022-02-13: 40 mg via INTRAVENOUS
  Administered 2022-02-13: 130 mg via INTRAVENOUS
  Administered 2022-02-13: 30 mg via INTRAVENOUS

## 2022-02-13 MED ORDER — LACTATED RINGERS IV SOLN: INTRAVENOUS | Status: DC | PRN

## 2022-02-13 MED ORDER — LIDOCAINE HCL (CARDIAC) PF 100 MG/5ML IV SOSY
PREFILLED_SYRINGE | INTRAVENOUS | Status: DC | PRN
  Administered 2022-02-13: 50 mg via INTRAVENOUS

## 2022-02-13 NOTE — Interval H&P Note (Signed)
History and Physical Interval Note:  02/13/2022 8:36 AM  Daniel [name]  has presented today for surgery, with the diagnosis of screening colonoscopy.  The various methods of treatment have been discussed with the patient and family. After consideration of risks, benefits and other options for treatment, the patient has consented to  Procedure(s) with comments: COLONOSCOPY WITH PROPOFOL (N/A) - 9:00am as a surgical intervention.  The patient's history has been reviewed, patient examined, no change in status, stable for surgery.  I have reviewed the patient's chart and labs.  Questions were answered to the patient's satisfaction.     Eloise Harman

## 2022-02-13 NOTE — Discharge Instructions (Addendum)

## 2022-02-13 NOTE — Transfer of Care (Signed)
Immediate Anesthesia Transfer of Care Note  Patient: Daniel [name]  Procedure(s) Performed: COLONOSCOPY WITH PROPOFOL POLYPECTOMY  Patient Location: Endoscopy Unit  Anesthesia Type:General  Level of Consciousness: drowsy  Airway & Oxygen Therapy: Patient Spontanous Breathing  Post-op Assessment: Report given to RN and Post -op Vital signs reviewed and stable  Post vital signs: Reviewed and stable  Last Vitals:  Vitals Value Taken Time  BP    Temp    Pulse    Resp    SpO2      Last Pain:  Vitals:   02/13/22 0840  TempSrc:   PainSc: 0-No pain      Patients Stated Pain Goal: 7 (79/43/27 6147)  Complications: No notable events documented.

## 2022-02-13 NOTE — Op Note (Signed)
[facility] Patient Name: Daniel [name] Procedure Date: 02/13/2022 7:59 AM MRN: [id number] Date of Birth: 1966/06/21 Attending MD: Elon Alas. Edgar Frisk CSN: [id number] Age: 56 Admit Type: Outpatient Procedure:                Colonoscopy Indications:              Screening for colorectal malignant neoplasm Providers:                Elon Alas. Abbey Chatters, DO, Jessica Boudreaux, Casimer Bilis, Technician Referring MD:              Medicines:                See the Anesthesia note for documentation of the                            administered medications Complications:            No immediate complications. Estimated Blood Loss:     Estimated blood loss was minimal. Procedure:                Pre-Anesthesia Assessment:                           - The anesthesia plan was to use monitored                            anesthesia care (MAC).                           After obtaining informed consent, the colonoscope                            was passed under direct vision. Throughout the                            procedure, the patient's blood pressure, pulse, and                            oxygen saturations were monitored continuously. The                            [id number]-HQ190L ([id number]) scope was introduced through                            the anus and advanced to the the cecum, identified                            by appendiceal orifice and ileocecal valve. The                            colonoscopy was performed without difficulty. The                            patient tolerated the procedure well. The  quality                            of the bowel preparation was evaluated using the                            BBPS [facility] Bowel Preparation Scale) with scores                            of: Right Colon = 3, Transverse Colon = 3 and Left                            Colon = 3 (entire mucosa seen well with no residual                             staining, small fragments of stool or opaque                            liquid). The total BBPS score equals 9. Scope In: 8:43:31 AM Scope Out: 8:59:59 AM Scope Withdrawal Time: 0 hours 14 minutes 38 seconds  Total Procedure Duration: 0 hours 16 minutes 28 seconds  Findings:      The perianal and digital rectal examinations were normal.      Non-bleeding internal hemorrhoids were found during endoscopy.      A 5 mm polyp was found in the descending colon. The polyp was sessile.       The polyp was removed with a cold snare. Resection and retrieval were       complete. Impression:               - Non-bleeding internal hemorrhoids.                           - One 5 mm polyp in the descending colon, removed                            with a cold snare. Resected and retrieved. Moderate Sedation:      Per Anesthesia Care Recommendation:           - Patient has a contact number available for                            emergencies. The signs and symptoms of potential                            delayed complications were discussed with the                            patient. Return to normal activities tomorrow.                            Written discharge instructions were provided to the                            patient.                           -  Resume previous diet.                           - Continue present medications.                           - Await pathology results.                           - Repeat colonoscopy in 5 years for surveillance.                           - Return to GI clinic PRN. Procedure Code(s):        --- Professional ---                           [id number], Colonoscopy, flexible; with removal of                            tumor(s), polyp(s), or other lesion(s) by snare                            technique Diagnosis Code(s):        --- Professional ---                           Z12.11, Encounter for screening for malignant                            neoplasm of  colon                           K63.5, Polyp of colon                           K64.8, Other hemorrhoids CPT copyright 2019 American Medical Association. All rights reserved. The codes documented in this report are preliminary and upon coder review may  be revised to meet current compliance requirements. Elon Alas. Abbey Chatters, DO Corning Zymier Rodgers, DO 02/13/2022 9:03:05 AM This report has been signed electronically. Number of Addenda: 0

## 2022-02-13 NOTE — Anesthesia Preprocedure Evaluation (Signed)
Anesthesia Evaluation  Patient identified by MRN, date of birth, ID band Patient awake    Reviewed: Allergy & Precautions, H&P , NPO status , Patient's Chart, lab work & pertinent test results, reviewed documented beta blocker date and time   Airway Mallampati: II  TM Distance: >3 FB Neck ROM: full    Dental no notable dental hx.    Pulmonary neg pulmonary ROS,    Pulmonary exam normal breath sounds clear to auscultation       Cardiovascular Exercise Tolerance: Good negative cardio ROS   Rhythm:regular Rate:Normal     Neuro/Psych negative neurological ROS  negative psych ROS   GI/Hepatic Neg liver ROS, GERD  Medicated,  Endo/Other  negative endocrine ROS  Renal/GU negative Renal ROS  negative genitourinary   Musculoskeletal   Abdominal   Peds  Hematology negative hematology ROS (+)   Anesthesia Other Findings   Reproductive/Obstetrics negative OB ROS                             Anesthesia Physical Anesthesia Plan  ASA: 2  Anesthesia Plan: General   Post-op Pain Management:    Induction:   PONV Risk Score and Plan: Propofol infusion  Airway Management Planned:   Additional Equipment:   Intra-op Plan:   Post-operative Plan:   Informed Consent: I have reviewed the patients History and Physical, chart, labs and discussed the procedure including the risks, benefits and alternatives for the proposed anesthesia with the patient or authorized representative who has indicated his/her understanding and acceptance.     Dental Advisory Given  Plan Discussed with: CRNA  Anesthesia Plan Comments:         Anesthesia Quick Evaluation  

## 2022-02-13 NOTE — Anesthesia Procedure Notes (Signed)
Date/Time: 02/13/2022 8:44 AM Performed by: Orlie Dakin, CRNA Pre-anesthesia Checklist: Patient identified, Emergency Drugs available, Suction available and Patient being monitored Patient Re-evaluated:Patient Re-evaluated prior to induction Oxygen Delivery Method: Nasal cannula Induction Type: IV induction Placement Confirmation: positive ETCO2

## 2022-02-14 ENCOUNTER — Encounter (HOSPITAL_COMMUNITY): Payer: Self-pay

## 2022-02-14 ENCOUNTER — Ambulatory Visit (HOSPITAL_COMMUNITY): Admission: RE | Admit: 2022-02-14 | Payer: 59 | Source: Ambulatory Visit

## 2022-02-14 LAB — SURGICAL PATHOLOGY

## 2022-02-14 NOTE — Anesthesia Postprocedure Evaluation (Signed)
Anesthesia Post Note  Patient: Daniel [name]  Procedure(s) Performed: COLONOSCOPY WITH PROPOFOL POLYPECTOMY  Patient location during evaluation: Phase II Anesthesia Type: General Level of consciousness: awake Pain management: pain level controlled Vital Signs Assessment: post-procedure vital signs reviewed and stable Respiratory status: spontaneous breathing and respiratory function stable Cardiovascular status: blood pressure returned to baseline and stable Postop Assessment: no headache and no apparent nausea or vomiting Anesthetic complications: no Comments: Late entry   No notable events documented.   Last Vitals:  Vitals:   02/13/22 0744 02/13/22 0901  BP: 124/87 (!) 90/46  Pulse: 69 87  Resp: 16 20  Temp: 36.6 C 36.5 C  SpO2: 97% 96%    Last Pain:  Vitals:   02/13/22 0901  TempSrc: Oral  PainSc: 0-No pain                 Louann Sjogren

## 2022-02-21 ENCOUNTER — Encounter (HOSPITAL_COMMUNITY): Payer: Self-pay | Admitting: Internal Medicine

## 2022-02-27 ENCOUNTER — Other Ambulatory Visit (HOSPITAL_COMMUNITY): Payer: Self-pay | Admitting: Nephrology

## 2022-02-27 ENCOUNTER — Ambulatory Visit (HOSPITAL_COMMUNITY)
Admission: RE | Admit: 2022-02-27 | Discharge: 2022-02-27 | Disposition: A | Discharge status: Home / Self Care | Payer: 59 | Source: Ambulatory Visit | Attending: Nephrology | Admitting: Nephrology

## 2022-02-27 DIAGNOSIS — Z905 Acquired absence of kidney: Secondary | ICD-10-CM | POA: Diagnosis present

## 2022-02-27 DIAGNOSIS — E1122 Type 2 diabetes mellitus with diabetic chronic kidney disease: Secondary | ICD-10-CM

## 2022-02-27 DIAGNOSIS — C641 Malignant neoplasm of right kidney, except renal pelvis: Secondary | ICD-10-CM

## 2022-02-27 DIAGNOSIS — N181 Chronic kidney disease, stage 1: Secondary | ICD-10-CM | POA: Insufficient documentation

## 2022-02-27 DIAGNOSIS — R718 Other abnormality of red blood cells: Secondary | ICD-10-CM

## 2022-02-27 LAB — POCT I-STAT CREATININE: Creatinine, Ser: 1.7 mg/dL — ABNORMAL HIGH (ref 0.61–1.24)

## 2022-02-27 MED ORDER — GADOBUTROL 1 MMOL/ML IV SOLN
10.0000 mL | Freq: Once | INTRAVENOUS | Status: AC | PRN
Start: 2022-02-27 — End: 2022-02-27
  Administered 2022-02-27: 10 mL via INTRAVENOUS

## 2022-06-05 DIAGNOSIS — E1122 Type 2 diabetes mellitus with diabetic chronic kidney disease: Secondary | ICD-10-CM | POA: Diagnosis not present

## 2022-06-05 DIAGNOSIS — Z905 Acquired absence of kidney: Secondary | ICD-10-CM | POA: Diagnosis not present

## 2022-06-05 DIAGNOSIS — N170 Acute kidney failure with tubular necrosis: Secondary | ICD-10-CM | POA: Diagnosis not present

## 2022-06-05 DIAGNOSIS — N189 Chronic kidney disease, unspecified: Secondary | ICD-10-CM | POA: Diagnosis not present

## 2022-06-05 DIAGNOSIS — E559 Vitamin D deficiency, unspecified: Secondary | ICD-10-CM | POA: Diagnosis not present

## 2022-06-07 DIAGNOSIS — E1122 Type 2 diabetes mellitus with diabetic chronic kidney disease: Secondary | ICD-10-CM | POA: Diagnosis not present

## 2022-06-07 DIAGNOSIS — Z6835 Body mass index (BMI) 35.0-35.9, adult: Secondary | ICD-10-CM | POA: Diagnosis not present

## 2022-06-07 DIAGNOSIS — R718 Other abnormality of red blood cells: Secondary | ICD-10-CM | POA: Diagnosis not present

## 2022-06-07 DIAGNOSIS — Q8583 Von Hippel-Lindau syndrome: Secondary | ICD-10-CM | POA: Diagnosis not present

## 2022-06-07 DIAGNOSIS — E559 Vitamin D deficiency, unspecified: Secondary | ICD-10-CM | POA: Diagnosis not present

## 2022-06-07 DIAGNOSIS — Z905 Acquired absence of kidney: Secondary | ICD-10-CM | POA: Diagnosis not present

## 2022-06-07 DIAGNOSIS — N189 Chronic kidney disease, unspecified: Secondary | ICD-10-CM | POA: Diagnosis not present

## 2022-08-10 DIAGNOSIS — E782 Mixed hyperlipidemia: Secondary | ICD-10-CM | POA: Diagnosis not present

## 2022-08-15 ENCOUNTER — Other Ambulatory Visit: Payer: Self-pay

## 2022-08-15 DIAGNOSIS — N1831 Chronic kidney disease, stage 3a: Secondary | ICD-10-CM | POA: Diagnosis not present

## 2022-08-15 DIAGNOSIS — R351 Nocturia: Secondary | ICD-10-CM | POA: Diagnosis not present

## 2022-08-15 DIAGNOSIS — M25561 Pain in right knee: Secondary | ICD-10-CM

## 2022-08-15 DIAGNOSIS — E669 Obesity, unspecified: Secondary | ICD-10-CM | POA: Diagnosis not present

## 2022-08-15 DIAGNOSIS — Z905 Acquired absence of kidney: Secondary | ICD-10-CM | POA: Diagnosis not present

## 2022-08-15 DIAGNOSIS — K5900 Constipation, unspecified: Secondary | ICD-10-CM | POA: Diagnosis not present

## 2022-08-15 DIAGNOSIS — K429 Umbilical hernia without obstruction or gangrene: Secondary | ICD-10-CM | POA: Diagnosis not present

## 2022-08-15 DIAGNOSIS — Z86011 Personal history of benign neoplasm of the brain: Secondary | ICD-10-CM | POA: Diagnosis not present

## 2022-08-15 DIAGNOSIS — E782 Mixed hyperlipidemia: Secondary | ICD-10-CM | POA: Diagnosis not present

## 2022-08-15 DIAGNOSIS — M79601 Pain in right arm: Secondary | ICD-10-CM | POA: Diagnosis not present

## 2022-08-15 DIAGNOSIS — R945 Abnormal results of liver function studies: Secondary | ICD-10-CM | POA: Diagnosis not present

## 2022-08-15 DIAGNOSIS — E119 Type 2 diabetes mellitus without complications: Secondary | ICD-10-CM | POA: Diagnosis not present

## 2022-12-13 IMAGING — MR MR HEAD WO/W CM
14 of 16 series · 38 of 48 positions shown · IV contrast (gadavist)
Comparison: MRI head 09/07/2005

CLINICAL DATA: Renal cell cancer.  History of brain tumor.

EXAM:
MRI HEAD WITHOUT AND WITH CONTRAST
TECHNIQUE: Multiplanar, multiecho pulse sequences of the brain and surrounding
structures were obtained without and with intravenous contrast.
CONTRAST:  10mL GADAVIST GADOBUTROL 1 MMOL/ML IV SOLN

[Series 5: DWI · axial · 4.0mm · 0.88mm/px · z∈[-58,+91]mm · 3 of 39 slices shown (1 of 6)]
[im 1/39]
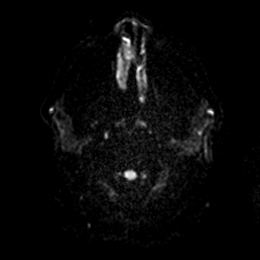
[im 20/39]
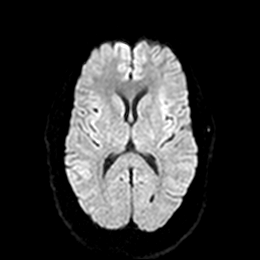
[im 39/39]
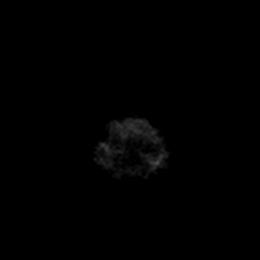

[Series 5: DWI · axial · 4.0mm · 0.88mm/px · z∈[-58,+91]mm · 3 of 39 slices shown (2 of 6)]
[im 1/39]
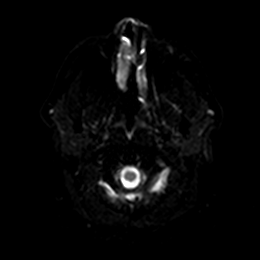
[im 20/39]
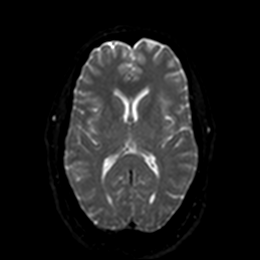
[im 39/39]
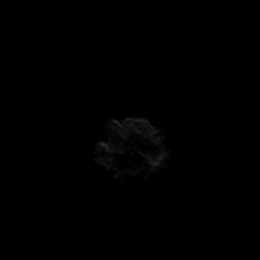

[Series 6: DWI · axial · 4.0mm · 0.88mm/px · z∈[-58,+91]mm · 3 of 39 slices shown (3 of 6)]
[im 1/39]
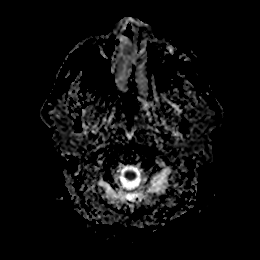
[im 20/39]
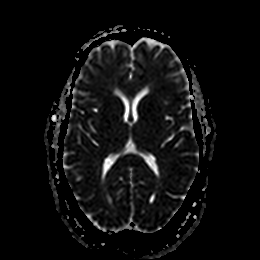
[im 39/39]
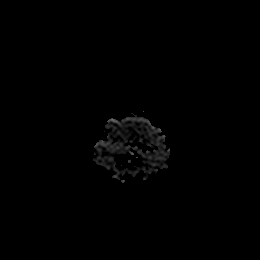

[Series 7: DWI · coronal · 5.0mm · 0.88mm/px · 3 of 31 slices shown (4 of 6)]
[im 1/31]
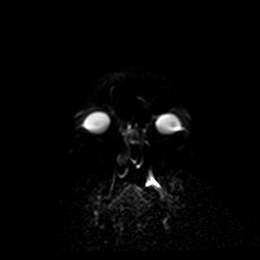
[im 16/31]
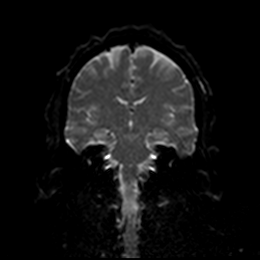
[im 31/31]
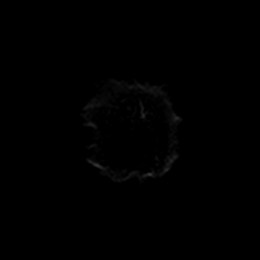

[Series 7: DWI · coronal · 5.0mm · 0.88mm/px · 3 of 31 slices shown (5 of 6)]
[im 1/31]
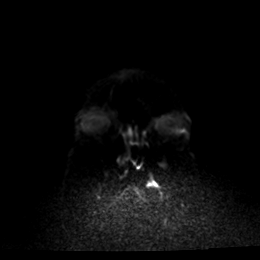
[im 16/31]
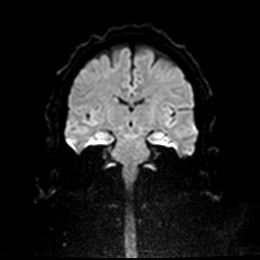
[im 31/31]
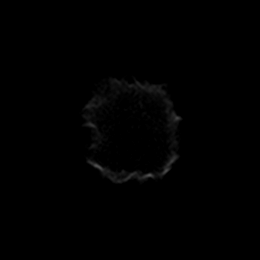

[Series 8: DWI · coronal · 5.0mm · 0.88mm/px · 3 of 31 slices shown (6 of 6)]
[im 1/31]
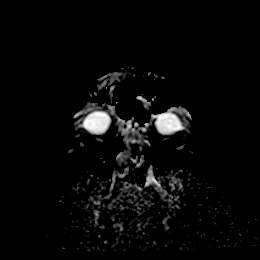
[im 16/31]
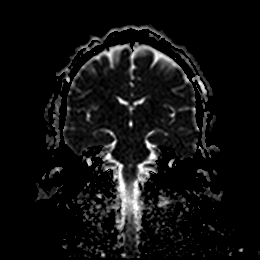
[im 31/31]
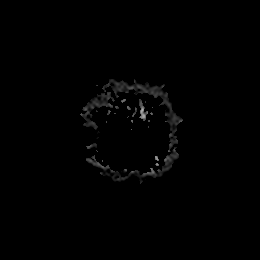

[Series 9: T1 · sagittal · 5.0mm · 0.94mm/px · 2 of 21 slices shown (1 of 2)]
[im 1/21]
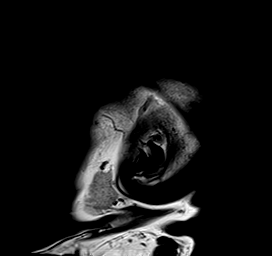
[im 21/21]
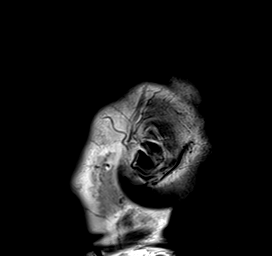

[Series 10: T2 · axial · 5.0mm · 0.72mm/px · z∈[-48,+82]mm · 2 of 20 slices shown (1 of 3)]
[im 1/20]
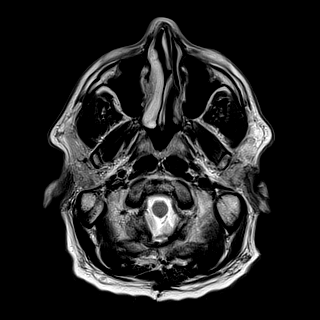
[im 20/20]
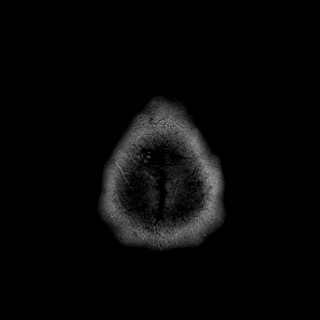

[Series 11: ax hemo · axial · 5.0mm · 0.86mm/px · z∈[-55,+86]mm · 2 of 25 slices shown]
[im 1/25]
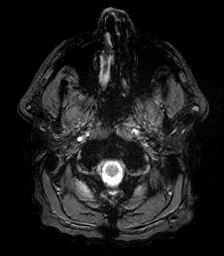
[im 25/25]
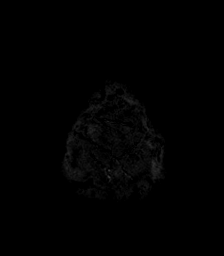

[Series 12: FLAIR · axial · 4.0mm · 0.43mm/px · z∈[-45,+76]mm · 3 of 32 slices shown]
[im 1/32]
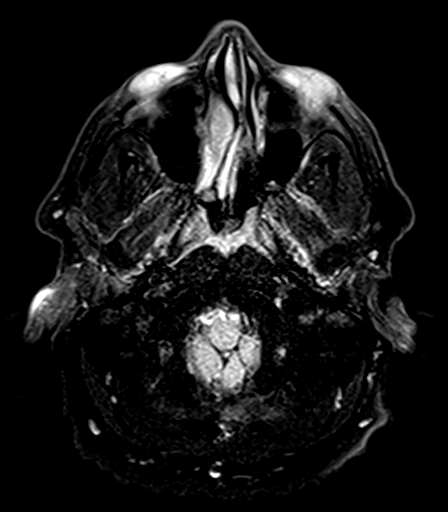
[im 16/32]
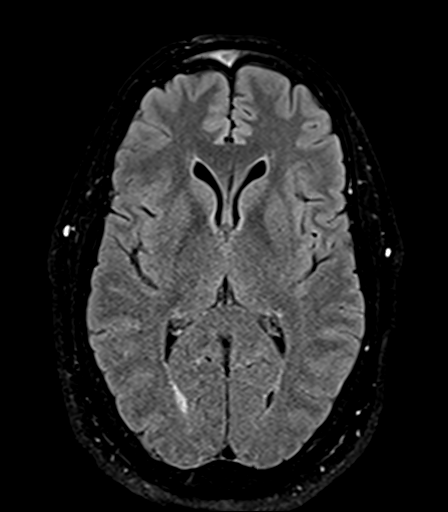
[im 32/32]
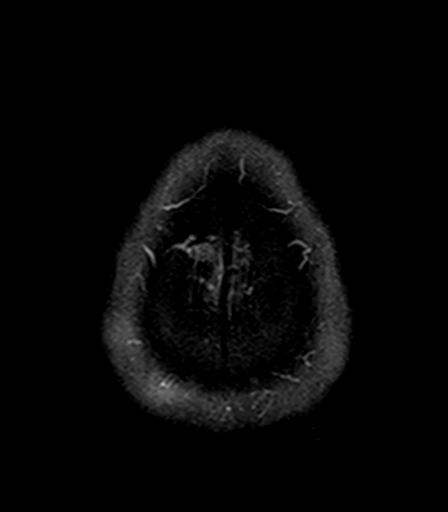

[Series 14: T2 · coronal · 5.0mm · 0.72mm/px · 3 of 28 slices shown (2 of 3)]
[im 1/28]
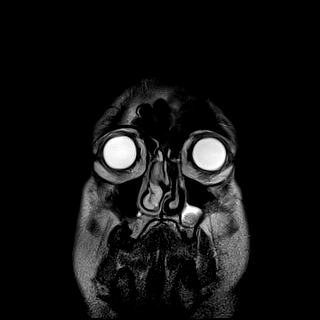
[im 14/28]
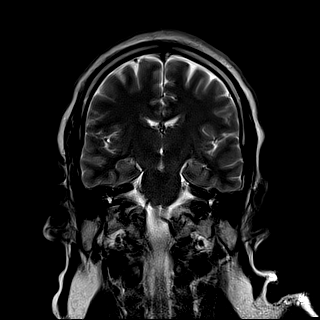
[im 28/28]
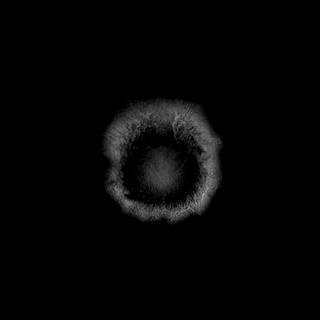

[Series 20: T1 post-contrast · coronal · 5.0mm · 0.34mm/px · 3 of 31 slices shown]
[im 1/31]
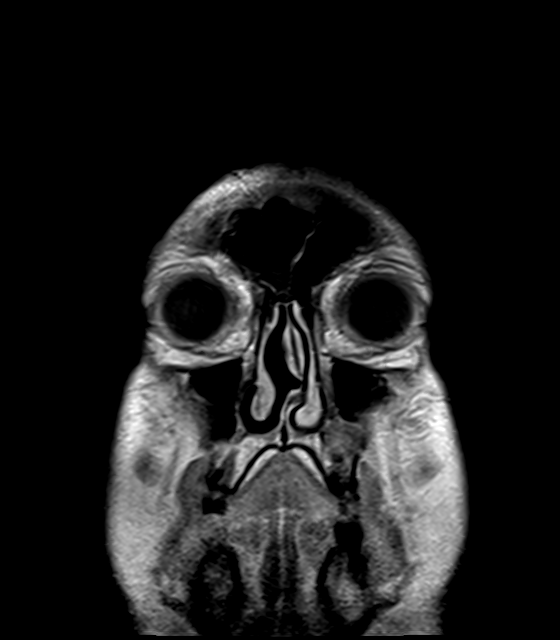
[im 16/31]
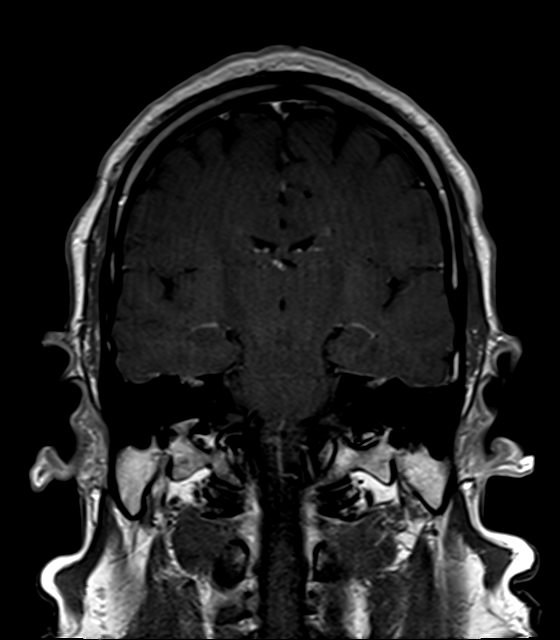
[im 31/31]
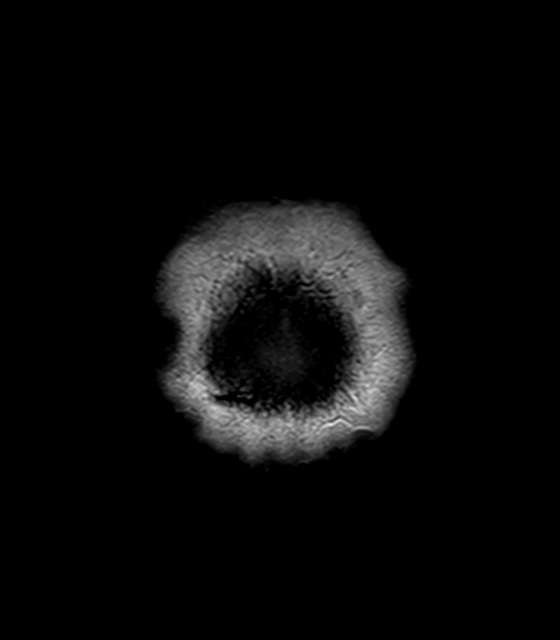

[Series 21: T1 · sagittal · 5.0mm · 0.94mm/px · 2 of 19 slices shown (2 of 2)]
[im 1/19]
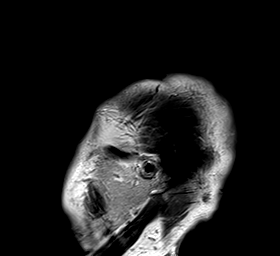
[im 19/19]
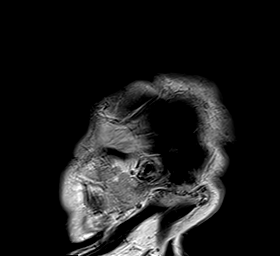

[Series 22: T2 · coronal · 5.0mm · 0.72mm/px · 3 of 28 slices shown (3 of 3)]
[im 1/28]
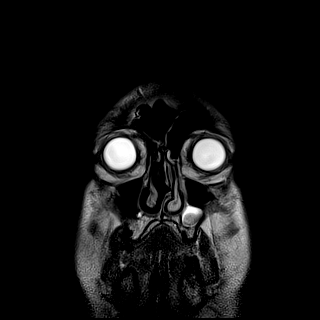
[im 14/28]
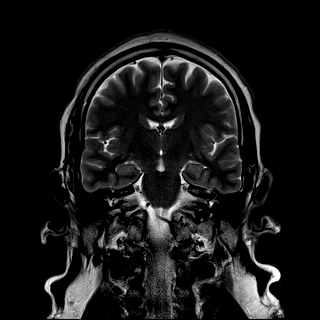
[im 28/28]
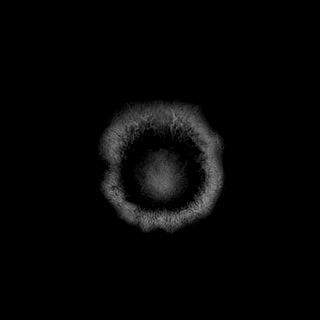

[38 of 48 positions shown; findings below may reference images not displayed]

FINDINGS: Brain: Postop resection of left cerebellar mass. No recurrent mass
lesion. No metastatic deposits in the brain

Ventricle size normal. Negative for acute or chronic infarct. No
hemorrhage, mass, or edema. Normal enhancement.

Vascular: Normal arterial flow voids.

Skull and upper cervical spine: Suboccipital craniectomy. Small
suboccipital fluid collection.

Sinuses/Orbits: Mild mucosal edema paranasal sinuses. Negative orbit

Other: None
IMPRESSION: Negative for metastatic disease

Postop resection of left cerebellar mass without recurrence.
